# Patient Record
Sex: Female | Born: 1945 | Race: Asian | Hispanic: No | Smoking: Never smoker
Health system: Southern US, Community
[De-identification: ages and names within clinical notes are randomized; demographics above are authoritative.]

## PROBLEM LIST (undated history)

## (undated) DIAGNOSIS — E079 Disorder of thyroid, unspecified: Secondary | ICD-10-CM

## (undated) DIAGNOSIS — I10 Essential (primary) hypertension: Secondary | ICD-10-CM

## (undated) DIAGNOSIS — E119 Type 2 diabetes mellitus without complications: Secondary | ICD-10-CM

---

## 2010-06-09 ENCOUNTER — Emergency Department (HOSPITAL_BASED_OUTPATIENT_CLINIC_OR_DEPARTMENT_OTHER): Admission: EM | Admit: 2010-06-09 | Discharge: 2010-06-09 | Payer: Self-pay | Admitting: Emergency Medicine

## 2010-06-09 ENCOUNTER — Ambulatory Visit: Payer: Self-pay | Admitting: Diagnostic Radiology

## 2010-10-11 LAB — COMPREHENSIVE METABOLIC PANEL
AST: 54 U/L — ABNORMAL HIGH (ref 0–37)
Albumin: 4.7 g/dL (ref 3.5–5.2)
Alkaline Phosphatase: 134 U/L — ABNORMAL HIGH (ref 39–117)
BUN: 7 mg/dL (ref 6–23)
CO2: 26 mEq/L (ref 19–32)
Chloride: 107 mEq/L (ref 96–112)
GFR calc Af Amer: >60 mL/min (ref >60)
GFR calc non Af Amer: >60 mL/min (ref >60)
Potassium: 3.5 mEq/L (ref 3.5–5.1)
Total Bilirubin: 0.6 mg/dL (ref 0.3–1.2)

## 2010-10-11 LAB — CBC
Hemoglobin: 14.8 g/dL (ref 12.0–15.0)
MCH: 30.2 pg (ref 26.0–34.0)
MCV: 87.4 fL (ref 78.0–100.0)
Platelets: 193 10*3/uL (ref 150–400)
RBC: 4.89 MIL/uL (ref 3.87–5.11)
WBC: 9.5 10*3/uL (ref 4.0–10.5)

## 2010-10-11 LAB — URINALYSIS, ROUTINE W REFLEX MICROSCOPIC
Bilirubin Urine: NEGATIVE
Ketones, ur: NEGATIVE mg/dL
Nitrite: NEGATIVE
Specific Gravity, Urine: 1.008 (ref 1.005–1.030)
Urobilinogen, UA: 0.2 mg/dL (ref 0.0–1.0)

## 2010-10-11 LAB — URINE MICROSCOPIC-ADD ON

## 2010-10-11 LAB — DIFFERENTIAL
Basophils Absolute: 0 10*3/uL (ref 0.0–0.1)
Basophils Relative: 0 % (ref 0–1)
Eosinophils Absolute: 0.8 10*3/uL — ABNORMAL HIGH (ref 0.0–0.7)
Eosinophils Relative: 8 % — ABNORMAL HIGH (ref 0–5)
Monocytes Absolute: 0.6 10*3/uL (ref 0.1–1.0)

## 2010-10-11 LAB — POCT CARDIAC MARKERS: Troponin i, poc: <0.05 ng/mL (ref 0.00–0.09)

## 2010-10-11 LAB — PROTIME-INR: Prothrombin Time: 12.3 seconds (ref 11.6–15.2)

## 2011-05-17 ENCOUNTER — Other Ambulatory Visit: Payer: Self-pay

## 2011-05-17 ENCOUNTER — Emergency Department (HOSPITAL_BASED_OUTPATIENT_CLINIC_OR_DEPARTMENT_OTHER)
Admission: EM | Admit: 2011-05-17 | Discharge: 2011-05-17 | Disposition: A | Discharge status: Home / Self Care | Payer: Medicare Other | Attending: Emergency Medicine | Admitting: Emergency Medicine

## 2011-05-17 ENCOUNTER — Encounter: Payer: Self-pay | Admitting: Family Medicine

## 2011-05-17 ENCOUNTER — Emergency Department (INDEPENDENT_AMBULATORY_CARE_PROVIDER_SITE_OTHER): Payer: Medicare Other

## 2011-05-17 DIAGNOSIS — R42 Dizziness and giddiness: Secondary | ICD-10-CM

## 2011-05-17 DIAGNOSIS — I10 Essential (primary) hypertension: Secondary | ICD-10-CM

## 2011-05-17 DIAGNOSIS — E041 Nontoxic single thyroid nodule: Secondary | ICD-10-CM | POA: Insufficient documentation

## 2011-05-17 DIAGNOSIS — I517 Cardiomegaly: Secondary | ICD-10-CM

## 2011-05-17 HISTORY — DX: Essential (primary) hypertension: I10

## 2011-05-17 LAB — CBC
HCT: 40.2 % (ref 36.0–46.0)
Hemoglobin: 14 g/dL (ref 12.0–15.0)
RBC: 4.73 MIL/uL (ref 3.87–5.11)
RDW: 12.1 % (ref 11.5–15.5)
WBC: 8.5 10*3/uL (ref 4.0–10.5)

## 2011-05-17 LAB — BASIC METABOLIC PANEL
BUN: 9 mg/dL (ref 6–23)
CO2: 25 mEq/L (ref 19–32)
Chloride: 103 mEq/L (ref 96–112)
GFR calc Af Amer: >90 mL/min (ref >90)
Glucose, Bld: 116 mg/dL — ABNORMAL HIGH (ref 70–99)
Potassium: 3.6 mEq/L (ref 3.5–5.1)

## 2011-05-17 MED ORDER — CLONIDINE HCL 0.1 MG PO TABS
0.1000 mg | ORAL_TABLET | Freq: Once | ORAL | Status: AC
  Administered 2011-05-17: 0.1 mg via ORAL
  Filled 2011-05-17: qty 1

## 2011-05-17 NOTE — ED Notes (Signed)
Pt granddaughter sts pt went to PCP today for checkup and was told to go to ED due to elevated BP. Pt sts she feels "fine". Pt is currently taking medication for BP.

## 2011-05-17 NOTE — ED Provider Notes (Signed)
History     CSN: [id number] Arrival date & time: 05/17/2011 12:08 PM   First MD Initiated Contact with Patient 05/17/11 1218      Chief Complaint  Patient presents with  . Hypertension    (Consider location/radiation/quality/duration/timing/severity/associated sxs/prior treatment) HPI Comments: Pt states that she was over at her doctors office and she was sent over here  Patient is a 64 y.o. female presenting with hypertension. Analysia history is provided by Patty patient. Cheryal history is limited by a language barrier. A language interpreter was used.  Hypertension This is a chronic problem. Shyia current episode started today. Judeen problem occurs constantly. Lynee problem has been unchanged. Pertinent negatives include no chest pain, diaphoresis, headaches, numbness, visual change or weakness. Vessie symptoms are aggravated by nothing. Treatments tried: her normal bp medications.    Past Medical History  Diagnosis Date  . Hypertension     History reviewed. No pertinent past surgical history.  No family history on file.  History  Substance Use Topics  . Smoking status: Never Smoker   . Smokeless tobacco: Not on file  . Alcohol Use: No    OB History    Grav Para Term Preterm Abortions TAB SAB Ect Mult Living                  Review of Systems  Constitutional: Negative for diaphoresis.  Cardiovascular: Negative for chest pain.  Neurological: Negative for weakness, numbness and headaches.  All other systems reviewed and are negative.    Allergies  Review of patient's allergies indicates no known allergies.  Home Medications  No current outpatient prescriptions on file.  BP 248/101  Pulse 86  Temp(Src) 97.4 F (36.3 C) (Oral)  Resp 16  Ht 5\' 4"  (1.626 m)  Wt 175 lb (79.379 kg)  BMI 30.04 kg/m2  SpO2 98%  Physical Exam  Nursing note and vitals reviewed. Constitutional: She is oriented to person, place, and time. She appears well-nourished.  HENT:  Head:  Normocephalic.  Eyes: Pupils are equal, round, and reactive to light.  Neck: Normal range of motion.  Cardiovascular: Normal rate and regular rhythm.   Pulmonary/Chest: Effort normal and breath sounds normal.  Abdominal: Soft. Bowel sounds are normal.  Musculoskeletal: Normal range of motion.  Neurological: She is alert and oriented to person, place, and time.  Skin: Skin is warm and dry.  Psychiatric: She has a normal mood and affect.    ED Course  Procedures (including critical care time)  Labs Reviewed  BASIC METABOLIC PANEL - Abnormal; Notable for [place] following:    Glucose, Bld 116 (*)    Creatinine, Ser 0.40 (*)    All other components within normal limits  CBC   Dg Chest 2 View  05/17/2011  *RADIOLOGY REPORT*  Clinical Data: Hypertension, dizziness  CHEST - 2 VIEW  Comparison: 06/09/2010  Findings: Enlargement of cardiac silhouette. Tortuous aorta. Prominent right paratracheal soft tissues with right-to-left mass effect upon Kayna trachea question right thyroid mass. Minimal scarring right middle lobe. Lungs otherwise clear. Minimal peribronchial thickening. No pleural effusion or pneumothorax. Bones unremarkable.  IMPRESSION: Enlargement of cardiac silhouette. Question right thyroid mass; recommend thyroid sonography to evaluate. Minimal right middle lobe scarring. No acute pulmonary abnormalities.  Original Report Authenticated By: Lollie Marrow, M.D.   Date: 05/17/2011  Rate: 82  Rhythm: normal sinus rhythm  QRS Axis: normal  Intervals: normal  ST/T Wave abnormalities: normal  Conduction Disutrbances:none  Narrative Interpretation:   Old EKG Reviewed: none  available and unchanged    1. Hypertension   2. Thyroid nodule       MDM  You need to make sure that you are taking all your blood pressure medication: and you should follow up with your doctor later this week        Teressa Lower, NP 05/17/11 1529

## 2011-05-17 NOTE — ED Provider Notes (Signed)
History     CSN: [id number] Arrival date & time: 05/17/2011 12:08 PM   First MD Initiated Contact with Patient 05/17/11 1218      Chief Complaint  Patient presents with  . Hypertension    (Consider location/radiation/quality/duration/timing/severity/associated sxs/prior treatment) HPI  Past Medical History  Diagnosis Date  . Hypertension     History reviewed. No pertinent past surgical history.  No family history on file.  History  Substance Use Topics  . Smoking status: Never Smoker   . Smokeless tobacco: Not on file  . Alcohol Use: No    OB History    Grav Para Term Preterm Abortions TAB SAB Ect Mult Living                  Review of Systems  Allergies  Lisinopril  Home Medications  No current outpatient prescriptions on file.  BP 217/103  Pulse 87  Temp(Src) 97.4 F (36.3 C) (Oral)  Resp 16  Ht 5\' 4"  (1.626 m)  Wt 175 lb (79.379 kg)  BMI 30.04 kg/m2  SpO2 99%  Physical Exam  ED Course  Procedures (including critical care time)  Labs Reviewed  BASIC METABOLIC PANEL - Abnormal; Notable for April Sheppard following:    Glucose, Bld 116 (*)    Creatinine, Ser 0.40 (*)    All other components within normal limits  CBC   Dg Chest 2 View  05/17/2011  *RADIOLOGY REPORT*  Clinical Data: Hypertension, dizziness  CHEST - 2 VIEW  Comparison: 06/09/2010  Findings: Enlargement of cardiac silhouette. Tortuous aorta. Prominent right paratracheal soft tissues with right-to-left mass effect upon April Sheppard trachea question right thyroid mass. Minimal scarring right middle lobe. Lungs otherwise clear. Minimal peribronchial thickening. No pleural effusion or pneumothorax. Bones unremarkable.  IMPRESSION: Enlargement of cardiac silhouette. Question right thyroid mass; recommend thyroid sonography to evaluate. Minimal right middle lobe scarring. No acute pulmonary abnormalities.  Original Report Authenticated By: Lollie Marrow, M.D.     1. Hypertension   2. Thyroid nodule        MDM    Medical screening examination/treatment/procedure(s) were conducted as a shared visit with non-physician practitioner(s) and myself.  I personally evaluated April Sheppard patient during [name] encounter.  Pt with HTN here but no other sx. Unclear if pt is taking medications correctly.  Pt given clonidine to see if improves BP.  Waiting to see what she is taking.       Gwyneth Sprout, MD 05/17/11 1626

## 2012-10-22 IMAGING — CR DG CHEST 2V
2 series · 2 of 2 positions shown · non-contrast
Comparison: None.

CLINICAL DATA: Chest pain and shortness of breath.

CHEST - 2 VIEW

[w chest pa]
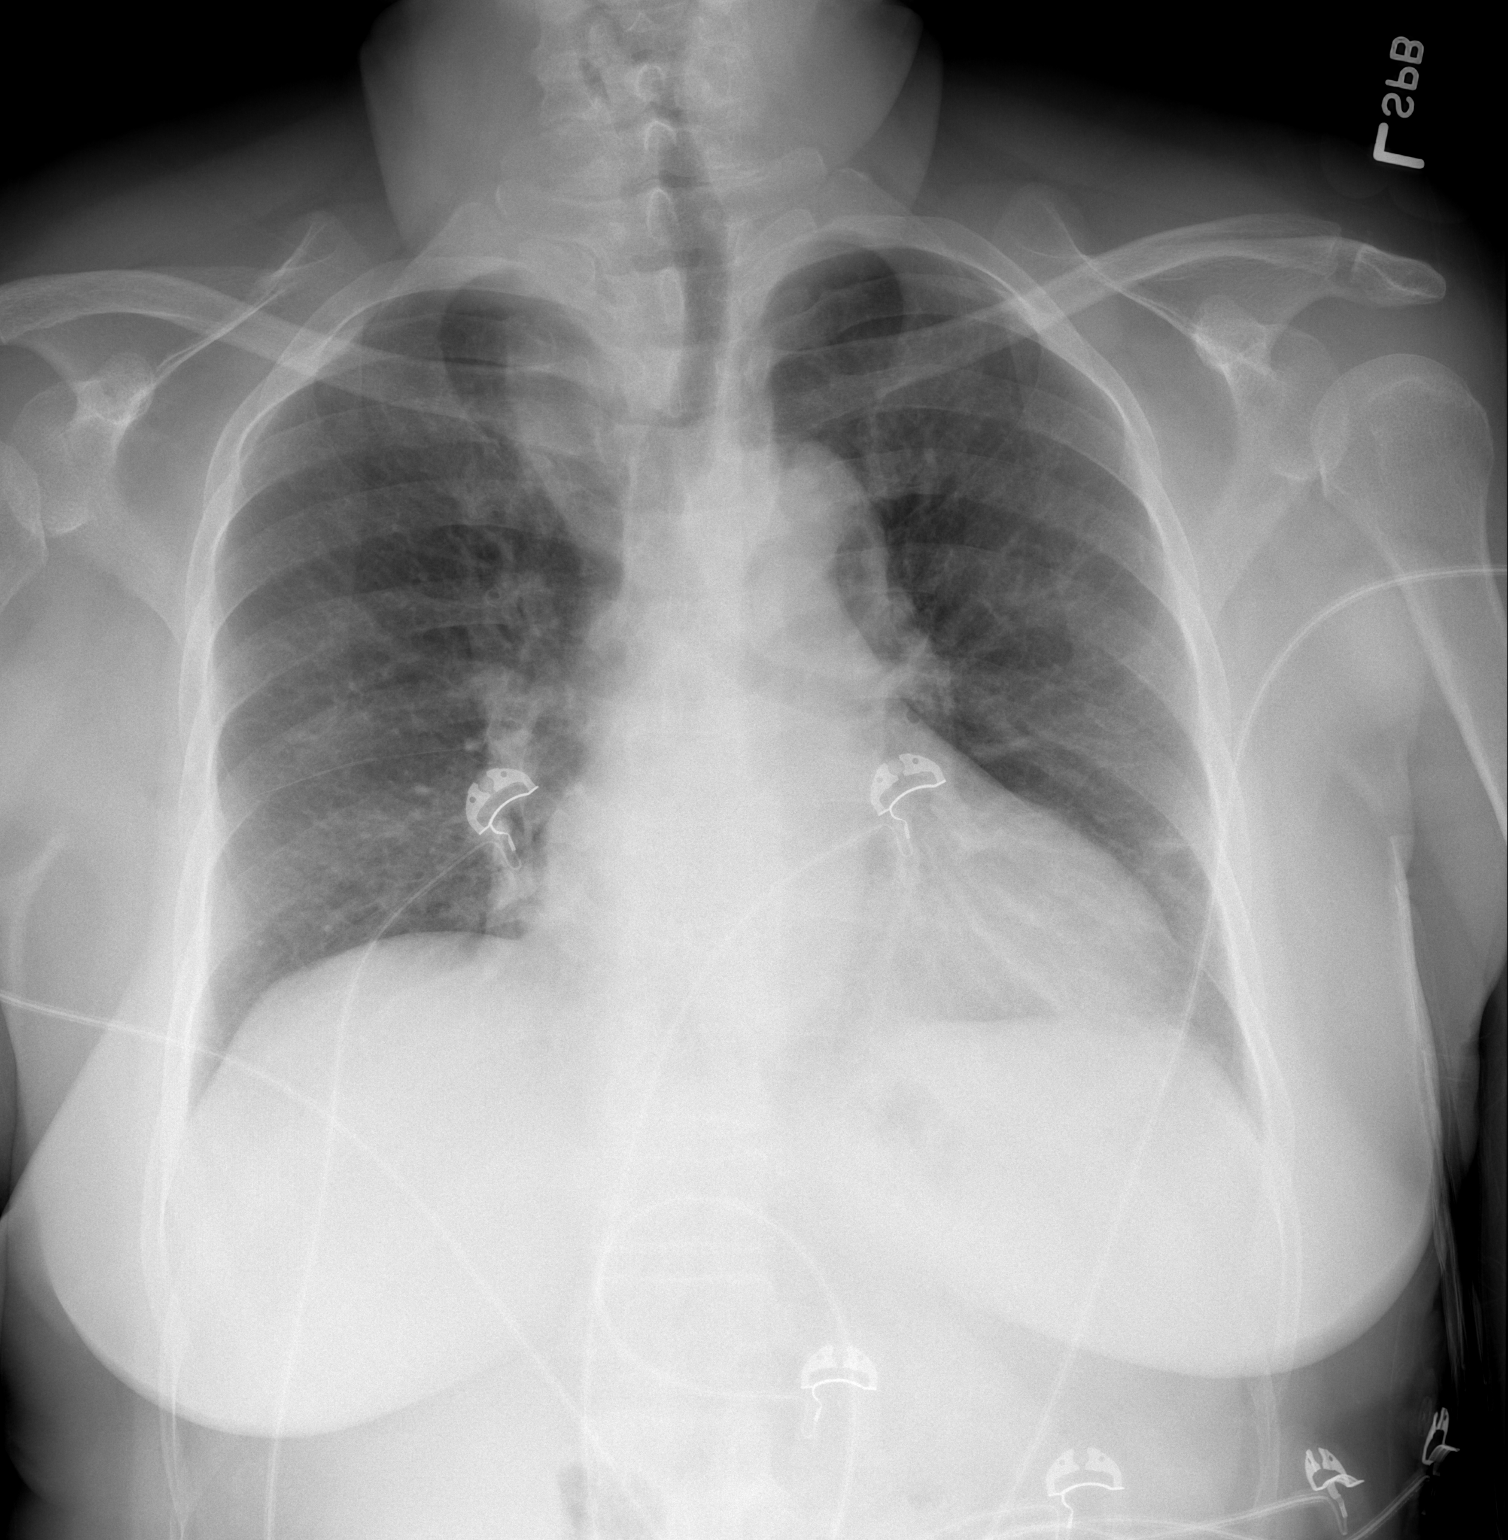

[w chest lat]
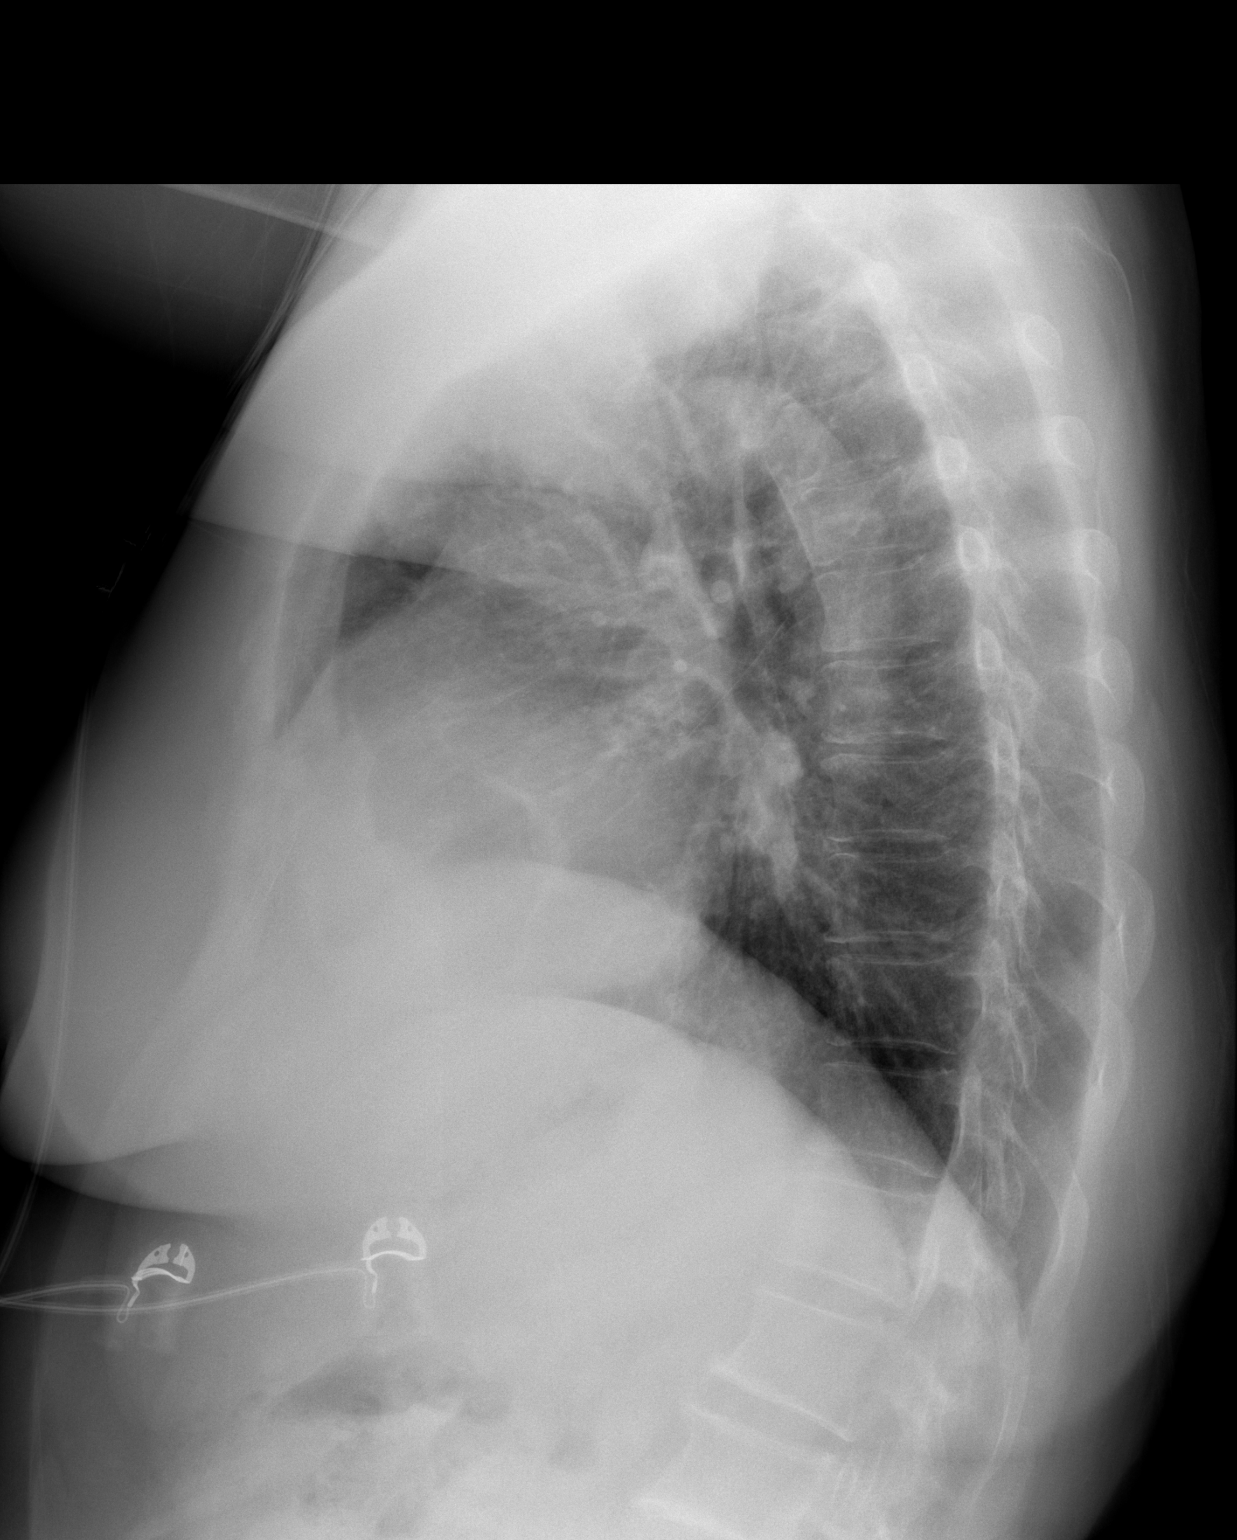

[2 of 2 positions shown; findings below may reference images not displayed]

FINDINGS: There is mild cardiomegaly.  The vascularity is normal
and the lungs are clear except for a tiny area of atelectasis or
scarring at the left lung base.

There is a mass effect upon the right side of the trachea just
above the thoracic inlet, probably due to enlargement of the right
lobe of the thyroid gland.

No osseous abnormality.
IMPRESSION: 1.  Mild cardiomegaly.
2.  Mass effect upon the trachea probably by an enlarged right lobe
of the thyroid gland.

## 2013-09-29 IMAGING — CR DG CHEST 2V
2 series · 2 of 2 positions shown · non-contrast
Comparison: 06/09/2010

CLINICAL DATA: Hypertension, dizziness

CHEST - 2 VIEW

[w chest pa]
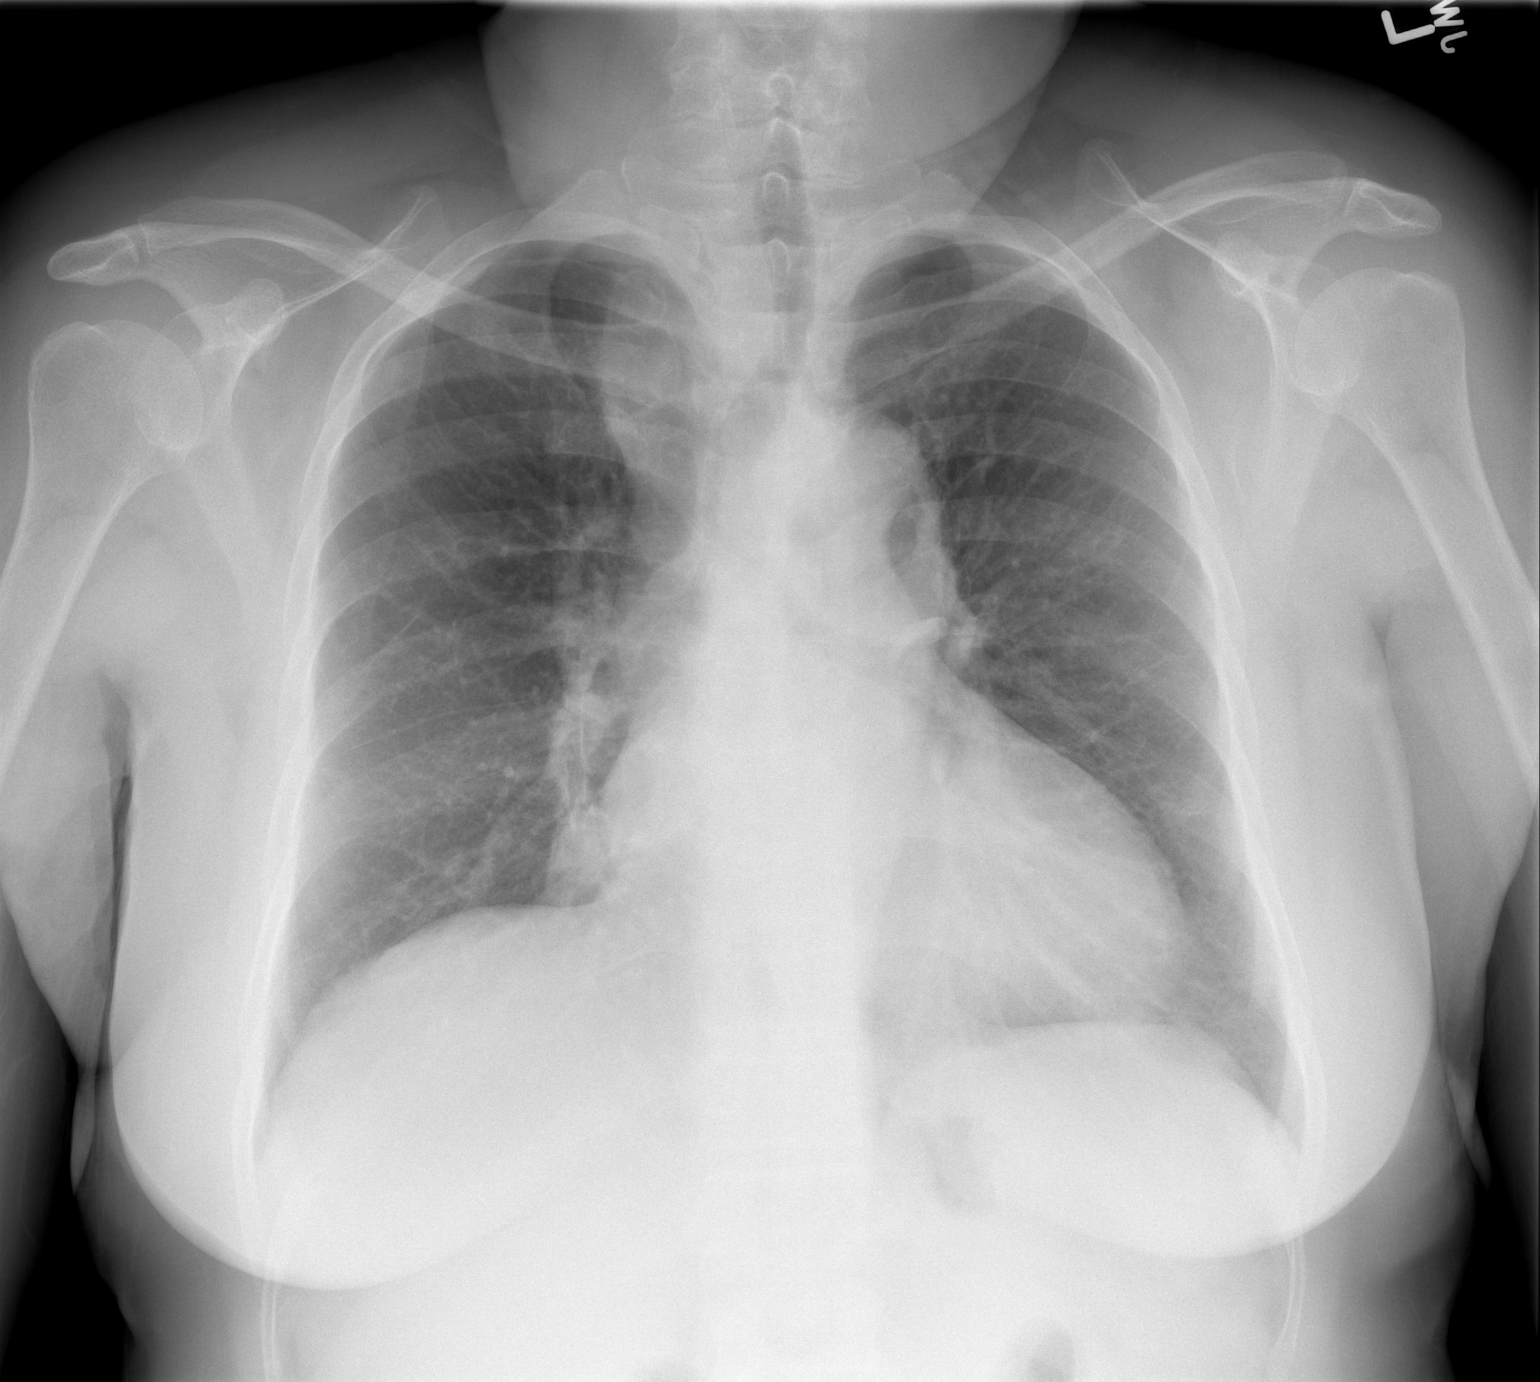

[w chest lat]
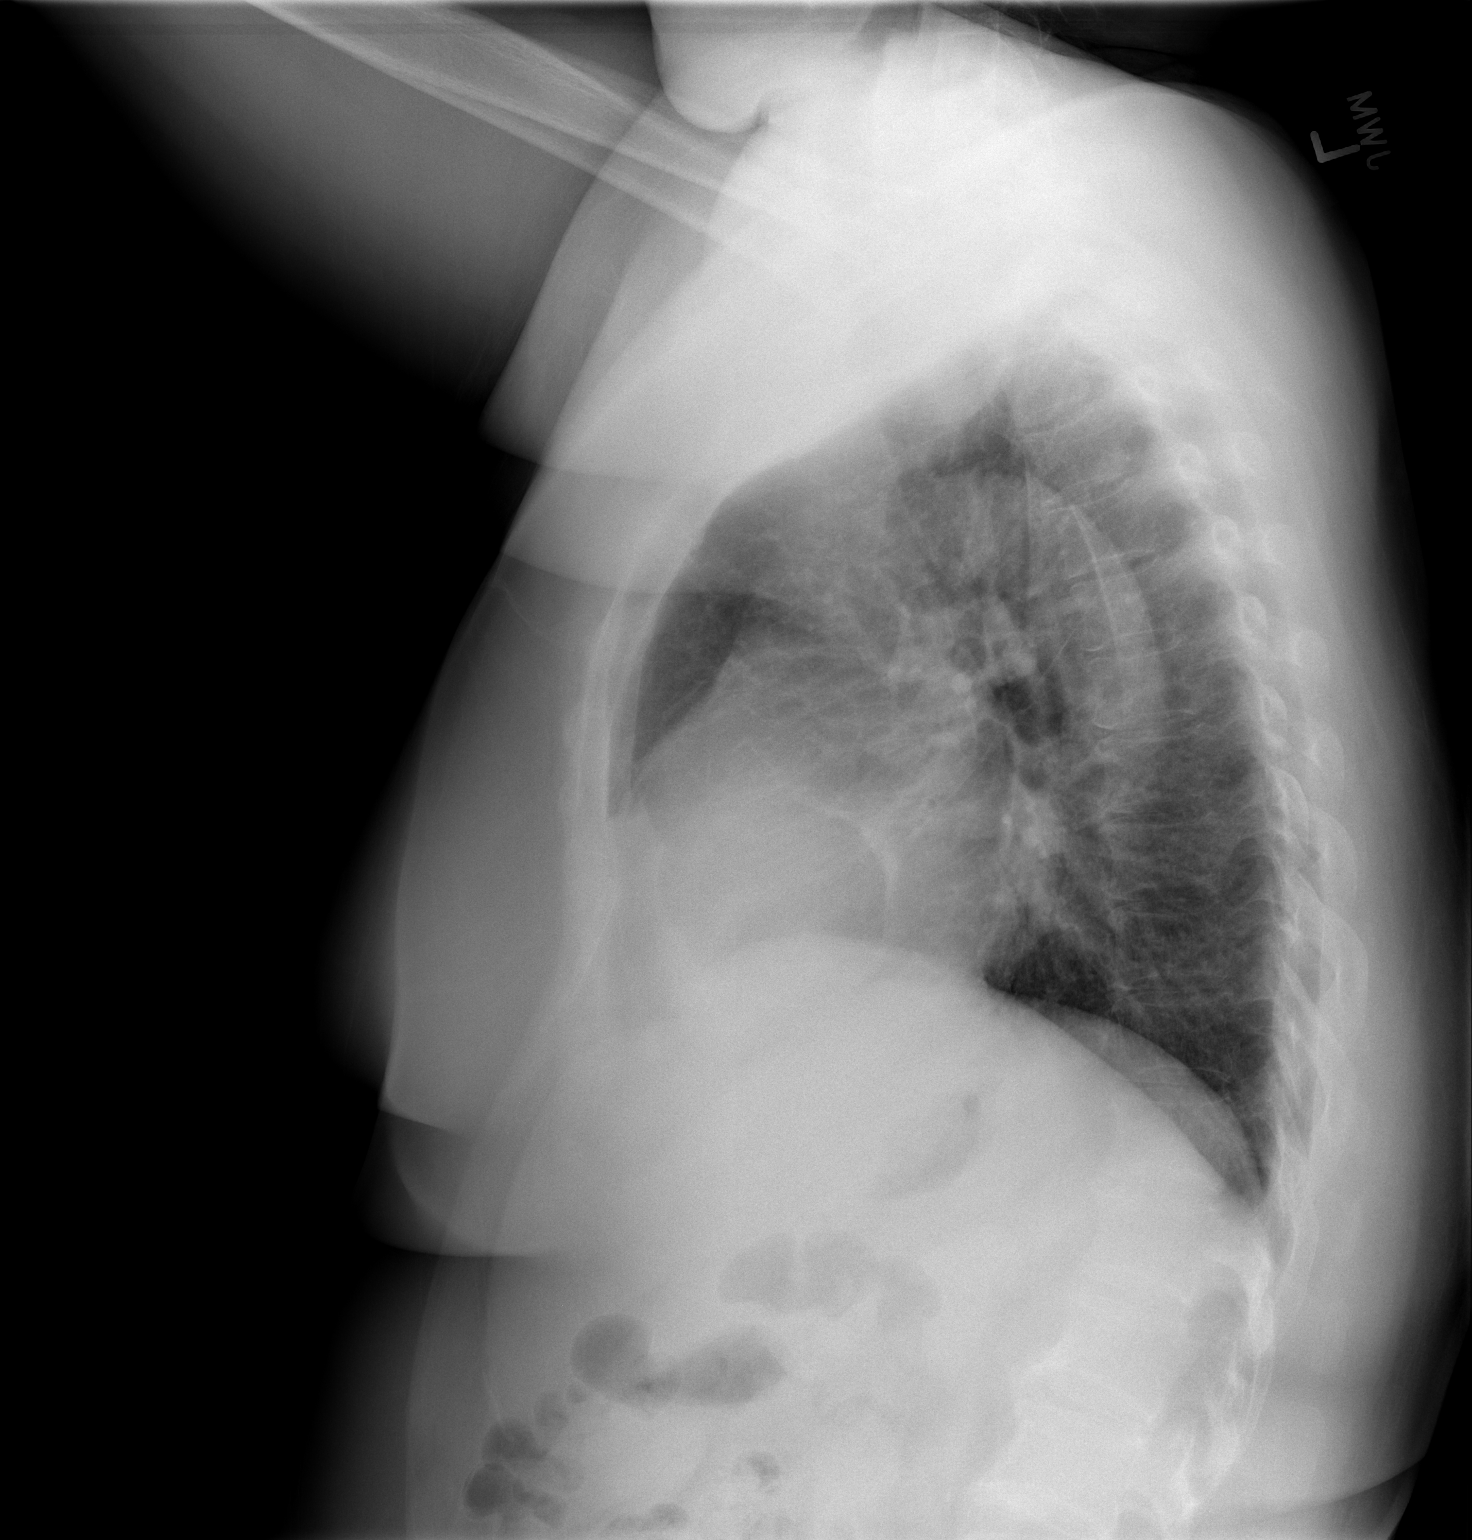

[2 of 2 positions shown; findings below may reference images not displayed]

FINDINGS: Enlargement of cardiac silhouette.
Tortuous aorta.
Prominent right paratracheal soft tissues with right-to-left mass
effect upon the trachea question right thyroid mass.
Minimal scarring right middle lobe.
Lungs otherwise clear.
Minimal peribronchial thickening.
No pleural effusion or pneumothorax.
Bones unremarkable.
IMPRESSION: Enlargement of cardiac silhouette.
Question right thyroid mass; recommend thyroid sonography to
evaluate.
Minimal right middle lobe scarring.
No acute pulmonary abnormalities.

## 2013-11-24 ENCOUNTER — Encounter (HOSPITAL_COMMUNITY): Payer: Self-pay | Admitting: Emergency Medicine

## 2013-11-24 ENCOUNTER — Emergency Department (INDEPENDENT_AMBULATORY_CARE_PROVIDER_SITE_OTHER)
Admission: EM | Admit: 2013-11-24 | Discharge: 2013-11-24 | Disposition: A | Discharge status: Home / Self Care | Payer: Medicare Other | Source: Home / Self Care | Attending: Family Medicine | Admitting: Family Medicine

## 2013-11-24 DIAGNOSIS — I10 Essential (primary) hypertension: Secondary | ICD-10-CM

## 2013-11-24 HISTORY — DX: Type 2 diabetes mellitus without complications: E11.9

## 2013-11-24 HISTORY — DX: Disorder of thyroid, unspecified: E07.9

## 2013-11-24 LAB — POCT I-STAT, CHEM 8
BUN: 8 mg/dL (ref 6–23)
Calcium, Ion: 1.18 mmol/L (ref 1.13–1.30)
Chloride: 100 mEq/L (ref 96–112)
Creatinine, Ser: 0.6 mg/dL (ref 0.50–1.10)
GLUCOSE: 200 mg/dL — AB (ref 70–99)
HEMATOCRIT: 42 % (ref 36.0–46.0)
HEMOGLOBIN: 14.3 g/dL (ref 12.0–15.0)
POTASSIUM: 3.7 meq/L (ref 3.7–5.3)
SODIUM: 139 meq/L (ref 137–147)
TCO2: 29 mmol/L (ref 0–100)

## 2013-11-24 MED ORDER — HYDROCHLOROTHIAZIDE 25 MG PO TABS: 25.0000 mg | ORAL_TABLET | Freq: Every day | ORAL | Status: DC

## 2013-11-24 MED ORDER — AMLODIPINE BESYLATE 10 MG PO TABS: 10.0000 mg | ORAL_TABLET | Freq: Every day | ORAL | Status: DC

## 2013-11-24 NOTE — ED Notes (Signed)
Was scheduled for eye surgery today, but was delayed due to hypertension. NAD

## 2013-11-24 NOTE — ED Provider Notes (Signed)
CSN: [id number]     Arrival date & time 11/24/13  1314 History   First MD Initiated Contact with Patient 11/24/13 1532     Chief Complaint  Patient presents with  . Hypertension   (Consider location/radiation/quality/duration/timing/severity/associated sxs/prior Treatment) Patient is a 68 y.o. female presenting with hypertension. Dashanique history is provided by Tiffanee patient.  Hypertension This is a chronic problem. Yana current episode started 3 to 5 hours ago (long standing hbp, went to surg center this am  for cataract  surg, found to have hbp 242/133 and surg cancelled. sent here for eval, pt admits to being nervous.at Marianny time.). [place] problem has been gradually improving. Pertinent negatives include no chest pain, no abdominal pain, no headaches and no shortness of breath.    Past Medical History  Diagnosis Date  . Hypertension   . Diabetes mellitus without complication   . Thyroid disease    History reviewed. No pertinent past surgical history. History reviewed. No pertinent family history. History  Substance Use Topics  . Smoking status: Never Smoker   . Smokeless tobacco: Not on file  . Alcohol Use: No   OB History   Grav Para Term Preterm Abortions TAB SAB Ect Mult Living                 Review of Systems  Constitutional: Negative.   Respiratory: Negative for chest tightness and shortness of breath.   Cardiovascular: Negative for chest pain, palpitations and leg swelling.  Gastrointestinal: Negative for abdominal pain.  Neurological: Negative for headaches.    Allergies  Lisinopril  Home Medications   Prior to Admission medications   Medication Sig Start Date End Date Taking? Authorizing Provider  amLODipine (NORVASC) 5 MG tablet Take 5 mg by mouth daily.      Historical Provider, MD  carvedilol (COREG) 12.5 MG tablet Take 12.5 mg by mouth 2 (two) times daily with a meal.      Historical Provider, MD  hydrALAZINE (APRESOLINE) 25 MG tablet Take 25 mg by mouth 4 (four)  times daily.      Historical Provider, MD  potassium chloride (KLOR-CON) 10 MEQ CR tablet Take 10 mEq by mouth daily.      Historical Provider, MD  triamterene-hydrochlorothiazide (DYAZIDE) 37.5-25 MG per capsule Take 1 capsule by mouth every morning.      Historical Provider, MD   BP 181/85  Pulse 67  Temp(Src) 98.6 F (37 C) (Oral)  Resp 20  SpO2 98% Physical Exam  Nursing note and vitals reviewed. Constitutional: She is oriented to person, place, and time. She appears well-developed and well-nourished.  Neck: Normal range of motion. Neck supple.  Cardiovascular: Normal heart sounds and intact distal pulses.   Pulmonary/Chest: Effort normal and breath sounds normal.  Musculoskeletal: She exhibits no edema.  Lymphadenopathy:    She has no cervical adenopathy.  Neurological: She is alert and oriented to person, place, and time.  Skin: Skin is warm and dry.    ED Course  Procedures (including critical care time) Labs Review Labs Reviewed  POCT I-STAT, CHEM 8 - Abnormal; Notable for Romanda following:    Glucose, Bld 200 (*)    All other components within normal limits    Imaging Review No results found.   MDM   1. Hypertension, accelerated        Linna HoffJames D Taj Arteaga, MD 11/24/13 [phone]

## 2020-12-17 NOTE — Unmapped External Note (Signed)
 This RN called to bedside by discharge lounge staff- pt found on her knees bedside bed. Pt assisted to seated position on [place] bed. Granddaughter called to assist with communication. Situation explained to granddaughter and staff requested she ask what happened. Pt and granddaughter report she was on [place] ground looking for shoes. No pain or discomfort reported. Pt able to stand and ambulate with walker and standby assistance. Pt reports she is going home and did not fall.    Electronically signed by: Monika Nichole Millen, RN 12/17/20 [phone]

## 2020-12-17 NOTE — Discharge Summary (Signed)
 ------------------------------------------------------------------------------- Attestation signed by Burnie April Coad, MD at 12/24/2020 11:41 AM . -------------------------------------------------------------------------------  Surgery Discharge Summary  Patient ID: April Sheppard [id number] 75 y.o. 1945-09-25  Admit date: 12/07/2020 Admitting Physician: Reta Peace, MD Admission Diagnoses:  NSTEMI  Discharge date: 12/17/20 Discharge Physician: Burnie, MD Discharge Diagnoses:  Principal Problem:   NSTEMI [facility]) Active Problems:   Type 2 diabetes mellitus (HCC)   Essential hypertension   Triple-vessel coronary artery disease   Hypothyroidism   Hypercholesterolemia   S/P CABG x 3   Acute blood loss as cause of postoperative anemia Resolved Problems:   Postoperative delirium   Thrombocytopenia (HCC)  Admission Condition: fair Discharged Condition: good  Indication for Admission: NSTEMI  Hospital Course:   April Sheppard is a 75 year old [place] female (speaks [place] only, granddaughter interpreter) presented to [facility] ED with chest pain and dyspnea patient reports symptoms started 2 days prior.  She underwent a work-up which led to a diagnosis of NSTEMI.  Cardiology was consulted and she underwent a cardiac catheterization and was found to have multi vessel coronary artery disease.  She has a medical history significant for hypertension, diabetes, and hypothyroidism.  CT surgery was consulted for possible surgical evaluation.  April Sheppard patient was evaluated and felt to be an adequate candidate for coronary artery bypass grafting.  This was explained to April Sheppard patient as well as her family and she agreed to move forward with April Sheppard surgery.  She was taken to April Sheppard operating room on 12/14/2019 where a three-vessel coronary artery bypass grafting took place without complication.  She was weaned and separated from April Sheppard cardiopulmonary bypass machine with April Sheppard use of inotropes.  She was transferred to  April Sheppard CCU intubated but in stable condition.  She is able to be extubated April Sheppard same day of surgery and maintained adequate oxygen saturations throughout her hospitalization.  On postoperative day #1 her chest tubes were changed to bulb suction.  Her pacing wires were rolled and taped.  Gentle diuresis was initiated.  She was able to initiate work with PT and OT.  Her hemodynamics remained stable.  She was able to be transferred to April Sheppard.  On postoperative day #2 it was noted that she had a difficult overnight with confusion and agitation.  As needed Haldol was given with good results.  She was able to get some rest early morning.  She remained hemodynamically stable.  Her blood pressure medications were adjusted due to mild hypertension.  Her April Sheppard drains were removed.  She continued with diuresis.  Subcu heparin was initiated for DVT prophylaxis.  On postoperative day #3 patient had a somewhat improved overnight with less confusion.  She continued with IV diuresis.  Her Foley catheter was removed this evening.  Her pacing wires were also removed.  Her chest x-April Sheppard was satisfactory.  She was breathing well on room air.  On postoperative day #4 April Sheppard patient remained hemodynamically stable.  Morning labs were satisfactory.  She was deemed stable for discharge home.  Home physical therapy was offered however family declined in addition to declining any inpatient rehab admission.  At April Sheppard time of discharge she is alert and oriented, afebrile, asymptomatic and has a stable sternum.  She has adequate bowel and bladder function.  She will be scheduled for follow-up visit with our office as well as follow-up appointments with her PCP and cardiologist.  Procedures/Surgeries performed during hospitalization: Procedure(s) (LRB): CABG ON PUMP - CORONARY ARTERY BYPASS GRAFTING X 3 (N/A)  Consults:  Orders Placed This  Encounter  Procedures  . Consult To Cardiology  . Consult To Rehabilitation    Significant  Diagnostic Studies:  labs:  Results for April Sheppard, April Sheppard (MRN [id number]) as of 12/18/2020 10:38  Ref. Range 12/17/2020 09:32  WBC Latest Ref Range: 4.4 - 11.0 x 10*3/uL 10.3  Red Blood Count Latest Ref Range: 4.10 - 5.10 x 10*6/uL 4.35  HEMOGLOBIN Latest Ref Range: 12.3 - 15.3 G/DL 86.9  HEMATOCRIT Latest Ref Range: 35.9 - 44.6 % 38.5  MCV Latest Ref Range: 80.0 - 96.0 FL 88.4  MCH Latest Ref Range: 27.5 - 33.2 PG 29.7  MCHC Latest Ref Range: 33.0 - 37.0 G/DL 66.2  RDW Latest Units: % 13.0  PLATELET COUNT Latest Ref Range: 150 - 450 X 10*3/uL 224  MPV Latest Ref Range: 6.8 - 10.2 FL 9.5  SODIUM Latest Ref Range: 135 - 146 MMOL/L 135  POTASSIUM Latest Ref Range: 3.5 - 5.3 MMOL/L 3.5  CHLORIDE Latest Ref Range: 98 - 110 MMOL/L 101  CO2 Latest Ref Range: 23 - 30 MMOL/L 25  BUN Latest Ref Range: 8 - 24 MG/DL 19  GLUCOSE Latest Ref Range: 70 - 99 MG/DL 706 (H)  CREATININE Latest Ref Range: 0.50 - 1.50 MG/DL 9.31  CALCIUM Latest Ref Range: 8.5 - 10.5 MG/DL 9.3  ANION GAP Latest Ref Range: 4 - 14 MMOL/L 9  Est. GFR Latest Ref Range: >=60 ML/MIN/1.73 M*2  >90    Treatments:  surgery: CABG X 3  Discharge Exam: General: Alert, oriented X 4, no apparent distress.  Head-Eyes-Ears-Nose-Throat: EOMI, pupils equal  Cardiovascular: Regular rate and rhythm, s1, s2, no murmurs.  Chest/Lungs: Clear to auscultation bilaterally, no wheezes or rhonchi.  Abdomen: Positive bowel sounds. Abdomen soft, nondistended, nontender. No HSM, rebound or guarding.  Extremities: 2+ peripheral pulses  Skin:  Incision is clean dry and intact  Other:  None    Disposition: Home  Patient Instructions:  Discharge Medications: Discharge Medication List as of 12/17/2020  2:11 PM    START taking these medications   Details  aspirin 81 MG chewable tablet *ANTIPLATELET* Take 1 tablet (81 mg total) by mouth daily., Starting Sat 12/18/2020, Until Fri 03/18/2021, Normal    atorvastatin (LIPITOR) 80 MG tablet Take 1 tablet (80 mg  total) by mouth daily at 6pm., Starting Fri 12/17/2020, Normal    clopidogreL (PLAVIX) 75 mg tablet  *ANTIPLATELET* Take 1 tablet (75 mg total) by mouth daily. Stop date: 11/2021, Starting Sat 12/18/2020, Until Sun 12/18/2021, Normal    furosemide (LASIX) 40 MG tablet Take 1 tablet (40 mg total) by mouth daily for 7 days., Starting Fri 12/17/2020, Until Fri 12/24/2020, Normal    potassium chloride ER (KLOR-CON-M, K-DUR) 20 MEQ extended release tablet Take 1 tablet (20 mEq total) by mouth daily for 7 days., Starting Fri 12/17/2020, Until Fri 12/24/2020, Normal    traMADoL (ULTRAM) 50 mg tablet Take 1 tablet (50 mg total) by mouth every 4 (four) hours as needed for up to 7 days., Starting Fri 12/17/2020, Until Fri 12/24/2020 at 2359, Normal      CONTINUE these medications which have NOT CHANGED   Details  carvediloL  (COREG ) 12.5 MG tablet TAKE 1 TABLET(12.5 MG) BY MOUTH TWICE DAILY WITH MEALS, Normal    empagliflozin (JARDIANCE) 25 mg Tab tablet Take 1 tablet (25 mg total) by mouth daily., Starting Mon 10/25/2020, Until Sun 01/23/2021, Normal    levothyroxine (SYNTHROID) 112 MCG tablet TAKE 1 TABLET(112 MCG) BY MOUTH DAILY AT 6 AM, Normal  metFORMIN (GLUCOPHAGE) 1000 MG tablet Take 1 tablet (1,000 mg total) by mouth 2 times daily with meals., Starting Mon 10/25/2020, Normal      STOP taking these medications     amLODIPine  (NORVASC ) 10 MG tablet      hydrALAZINE (APRESOLINE) 10 MG tablet      triamterene-hydrochlorothiazide  (DYAZIDE) 37.5-25 mg per capsule           Discharge Orders and Instructions    Walker   Complete by: As directed    Height: 1.575 m (5' 2)   Weight: 64.1 kg (141 lb 6.4 oz)   Length of Need: 99/Lifetime   Option(s): Rolling   Diet At Discharge   Complete by: As directed    Recommended diet at discharge: Cardiac diet   Activity At Discharge   Complete by: As directed    Recommended activity at discharge: Activity as tolerated   Driving restriction: No driving  while taking narcotic pain medication   Contact Us  / Call Us  To Schedule An Appointment   Complete by: As directed    Call: [phone]   Call if temperature greater than (Farenheit): 100.4   Call if: Bleeding present   Call if: Unable to control pain with prescribed pain medication   Physical Therapy Prescription for Home Health   Complete by: As directed       Follow-up Appointments [facility] APPOINTMENTS    Appointment Date and Time Provider Department Dept Phone Address   01/17/2021 10:15 AM [name] Lamar Sieving, DO Cardiology - [facility] Bld[facility] [phone] [address] [address]       Contact information for after-discharge care    Durable Medical Equipment    [facility] [place] .   Service: Durable Medical Equipment Contact information: [address] [place]  [address] [phone]                  Electronically signed by: April Almeda Greet, PA-C 12/17/2020 1:44 PM  Time spent on discharge: 30 mintues   Electronically signed by: April Almeda Greet, PA-C 12/18/20 1039    Electronically signed by: April Denise Rana, MD 12/24/20 1141

## 2021-01-17 NOTE — Progress Notes (Signed)
 Patient Name: April Sheppard Patient Date of Birth: Dec 13, 1945 Patient MR#: [id number]  PCP: Lum Vivian Dross, [place] Date: 01/17/2021      Cardiology Clinic Note  Assessment and Plan:    1.  Coronary artery disease with non-STEMI, now status post CABG.  April Sheppard patient seems to have recovered well from her bypass surgery.  Her incision looks okay.  She has declined cardiac rehab.  Her daughter says that she is exercising at home and seems to be getting back to her baseline level of function.  She has no chest pains or other symptoms of angina.  She is on medical therapy with aspirin, Plavix (for 12 months), carvedilol , and atorvastatin.  2.  Hyperlipidemia.  She is on atorvastatin 80 mg daily with no side effects.  I will check a lipid profile and AST today.  3.  Hypertension.  She is severely hypertensive.  Her systolic blood pressure was initially about 200.  On recheck, her blood pressure was 176/101.  I am increasing her carvedilol  to 25 mg daily, I have advised medication compliance.  She should follow-up with her PCP regarding her hypertension in April Sheppard next few weeks.  An ACE or ARB should be considered given her diabetes.  Given her history of lisinopril allergy, losartan should be tried, but she should be watched carefully for reactions.  4.  Type 2 diabetes.  This is managed by her PCP.  She is on an SGLT2 inhibitor with Jardiance.   ORDERS PLACED TODAY: 1.  CMP, lipid profile   FOLLOW UP: April Sheppard patient will come back to see me in 12 months, sooner if needed.     April Sheppard. Toribio DO [facility] Interventional Cardiology      Subjective:    Reason for Consult/Visit: Follow-up [name] f/u)    History of Present Illness:[name] is a 75 y.o. female who is for follow-up appointment after a hospitalization for non-STEMI resulting in CABG.  [name] patient declined a Airline pilot.  Her daughter is present and does translation from [name] patient's native [place]  It seems like she  is recovering well.  She does not have much chest pain anymore.  She does not have anginal symptoms any longer at all.  She does not have any heart failure symptoms.  She ambulates with a cane at home and has been increasing her exercise steadily.  She has declined cardiac rehab given her language problems.  She has no edema, orthopnea, or nocturnal dyspnea.  Eating well.  No palpitations.  No lightheadedness or syncope.  No falls.  No other complaints  Medication side effects:none  [name] following portions of [name] patient's history were reviewed and updated as appropriate:  Medical History: Past Medical History:  Diagnosis Date  . CAD (coronary artery disease)   . Diabetes (HCC)   . HTN (hypertension)   . Hypothyroidism   . Non-STEMI (non-ST elevated myocardial infarction) (HCC)       Medications:  Current Outpatient Medications  Medication Sig Dispense Refill  . aspirin 81 MG chewable tablet *ANTIPLATELET* Take 1 tablet (81 mg total) by mouth daily. 30 tablet 2  . atorvastatin (LIPITOR) 80 MG tablet Take 1 tablet (80 mg total) by mouth daily at 6pm. 90 tablet 3  . clopidogreL (PLAVIX) 75 mg tablet  *ANTIPLATELET* Take 1 tablet (75 mg total) by mouth daily. Stop date: 11/2021 30 tablet 11  . empagliflozin (JARDIANCE) 25 mg Tab tablet Take 1 tablet (25 mg total) by mouth daily. 90 tablet 3  .  levothyroxine (SYNTHROID) 112 MCG tablet TAKE 1 TABLET(112 MCG) BY MOUTH DAILY AT 6 AM 90 tablet 1  . metFORMIN (GLUCOPHAGE) 1000 MG tablet Take 1 tablet (1,000 mg total) by mouth 2 times daily with meals. 180 tablet 3  . carvediloL  (COREG ) 25 MG tablet Take 1 tablet (25 mg total) by mouth 2 times daily. 180 tablet 3  . furosemide (LASIX) 40 MG tablet Take 1 tablet (40 mg total) by mouth daily for 7 days. 7 tablet 0  . potassium chloride ER (KLOR-CON-M, K-DUR) 20 MEQ extended release tablet Take 1 tablet (20 mEq total) by mouth daily for 7 days. 7 tablet 0   No current facility-administered medications  for this visit.    Allergies: Allergies  Allergen Reactions  . Lisinopril Rash (ALLERGY/intolerance) and Swelling (ALLERGY/intolerance)    Social History: Social History   Tobacco Use  Smoking Status Never Smoker  Smokeless Tobacco Never Used   Social History   Substance and Sexual Activity  Alcohol Use Not Currently   Social History   Substance and Sexual Activity  Drug Use Never    Family History: There is no family history of premature coronary artery disease in any primary relatives. Family History  Problem Relation Age of Onset  . Hypertension Mother   . No known problems  Father     Review of Systems A complete ROS was performed with pertinent positives/negatives noted in [name]. [name] remainder of [name] ROS are negative.    Objective:     Physical Exam:  VITAL SIGNS:  Blood pressure (!) 176/101, pulse 100, height 1.651 m (5' 5), weight 59.8 kg (131 lb 12.8 oz), SpO2 100 %. Wt Readings from Last 3 Encounters:  01/17/21 59.8 kg (131 lb 12.8 oz)  12/17/20 64.5 kg (142 lb 3.9 oz)  12/07/20 63.5 kg (140 lb)   Body mass index is 21.93 kg/m.  General Appearance:   Elderly woman from [place].  Normal height and weight.  Looks generally well.  Good color.    HEENT:   PERRL, EOMI.  Oropharynx is moist. No icterus.   Neck: Carotid pulses are 2+.  No bruits.  No adenopathy, thyromegaly or masses.  Jugular venous pressure is normal.   Lungs:   Respirations are unlabored. [name] lungs are clear in all fields bilaterally without rales, rhonchi or wheezing.    Percussion note is normal throughout.  [name] midline sternotomy is healing well with no erythema or evidence of disunion.   Heart:  Heart sounds are regular with a rate of approximately 70 beats per minute.  Normal S1. Normal S2.  No S3 or S4.  There are no murmurs.  There is no rub.   Abdomen:   Soft, nontender and nondistended.  There are normal bowel sounds.  No bruits.  No masses or organomegaly.     Extremities: Warm. No rashes or ulcers. No lower extremity pitting edema.   Pulses: Radial pulses are 2+ and symmetrical.   Skin: No lower extremity rashes or ulcers   Neurologic:  Alpha patient is awake, alert, and oriented to time, place, person and situation, and no strength deficits.      EKG:   Lab Review: No results for input(s): WBC, ADJUSTEDWBC, RBC, HGB, HCT, MCV, MCH, MCHC, RDW, PLT, MPV in [place] last 168 hours.. No results for input(s): NA, K, CL, BUN, CREATININE, GFR in Se[place] last 168 hours.  Invalid input(s): C02,  GLU Lab Results  Component Value Date   LDL 122 (H) 12/07/2020  HDL 58 12/07/2020   INR 1.09 12/13/2020   Lab Results  Component Value Date   HGB 13.0 12/17/2020    Lab Results  Component Value Date   CREATININE 0.68 12/17/2020   No results found for: [facility]    CVD RISK STRATIFICATION RESULTS: Lab Results  Component Value Date   CHOL 199 12/07/2020   LDL 122 (H) 12/07/2020   HDL 58 12/07/2020   TSH 41.46 (H) 10/25/2020    [place] ASCVD Risk score Verdon DC Jr., et al., 2013) failed to calculate for Marialice following reasons:   Davionne patient has a prior MI or stroke diagnosis   Electronically signed by: April Lamar Sieving, DO 01/17/21 1052

## 2023-11-05 ENCOUNTER — Emergency Department (HOSPITAL_BASED_OUTPATIENT_CLINIC_OR_DEPARTMENT_OTHER)

## 2023-11-05 ENCOUNTER — Encounter (HOSPITAL_BASED_OUTPATIENT_CLINIC_OR_DEPARTMENT_OTHER): Payer: Self-pay | Admitting: Emergency Medicine

## 2023-11-05 ENCOUNTER — Emergency Department (HOSPITAL_BASED_OUTPATIENT_CLINIC_OR_DEPARTMENT_OTHER)
Admission: EM | Admit: 2023-11-05 | Discharge: 2023-11-05 | Disposition: A | Discharge status: Home / Self Care | Attending: Emergency Medicine | Admitting: Emergency Medicine

## 2023-11-05 ENCOUNTER — Other Ambulatory Visit: Payer: Self-pay

## 2023-11-05 DIAGNOSIS — R079 Chest pain, unspecified: Secondary | ICD-10-CM | POA: Diagnosis present

## 2023-11-05 DIAGNOSIS — E119 Type 2 diabetes mellitus without complications: Secondary | ICD-10-CM | POA: Diagnosis not present

## 2023-11-05 DIAGNOSIS — I10 Essential (primary) hypertension: Secondary | ICD-10-CM

## 2023-11-05 DIAGNOSIS — Z79899 Other long term (current) drug therapy: Secondary | ICD-10-CM | POA: Diagnosis not present

## 2023-11-05 LAB — CBC
HCT: 42.6 % (ref 36.0–46.0)
Hemoglobin: 15.5 g/dL — ABNORMAL HIGH (ref 12.0–15.0)
MCH: 30.4 pg (ref 26.0–34.0)
MCHC: 36.4 g/dL — ABNORMAL HIGH (ref 30.0–36.0)
MCV: 83.5 fL (ref 80.0–100.0)
Platelets: 196 10*3/uL (ref 150–400)
RBC: 5.1 MIL/uL (ref 3.87–5.11)
RDW: 11.9 % (ref 11.5–15.5)
WBC: 11 10*3/uL — ABNORMAL HIGH (ref 4.0–10.5)
nRBC: 0 % (ref 0.0–0.2)

## 2023-11-05 LAB — TROPONIN I (HIGH SENSITIVITY)
Troponin I (High Sensitivity): 12 ng/L (ref <18)
Troponin I (High Sensitivity): 15 ng/L (ref <18)

## 2023-11-05 LAB — D-DIMER, QUANTITATIVE: D-Dimer, Quant: 0.76 ug{FEU}/mL — ABNORMAL HIGH (ref 0.00–0.50)

## 2023-11-05 LAB — BASIC METABOLIC PANEL WITH GFR
Anion gap: 14 (ref 5–15)
BUN: 19 mg/dL (ref 8–23)
CO2: 22 mmol/L (ref 22–32)
Calcium: 9.6 mg/dL (ref 8.9–10.3)
Chloride: 94 mmol/L — ABNORMAL LOW (ref 98–111)
Creatinine, Ser: 0.81 mg/dL (ref 0.44–1.00)
GFR, Estimated: >60 mL/min (ref >60)
Glucose, Bld: 307 mg/dL — ABNORMAL HIGH (ref 70–99)
Potassium: 3.4 mmol/L — ABNORMAL LOW (ref 3.5–5.1)
Sodium: 130 mmol/L — ABNORMAL LOW (ref 135–145)

## 2023-11-05 MED ORDER — LABETALOL HCL 5 MG/ML IV SOLN
10.0000 mg | Freq: Once | INTRAVENOUS | Status: AC
  Administered 2023-11-05: 10 mg via INTRAVENOUS
  Filled 2023-11-05: qty 4

## 2023-11-05 MED ORDER — CARVEDILOL 12.5 MG PO TABS
12.5000 mg | ORAL_TABLET | Freq: Once | ORAL | Status: AC
  Administered 2023-11-05: 12.5 mg via ORAL
  Filled 2023-11-05: qty 1

## 2023-11-05 MED ORDER — AMLODIPINE BESYLATE 10 MG PO TABS
10.0000 mg | ORAL_TABLET | Freq: Every day | ORAL | 0 refills | Status: AC
Start: 2023-11-05 — End: 2024-05-09

## 2023-11-05 MED ORDER — AMLODIPINE BESYLATE 5 MG PO TABS
5.0000 mg | ORAL_TABLET | Freq: Once | ORAL | Status: AC
  Administered 2023-11-05: 5 mg via ORAL
  Filled 2023-11-05: qty 1

## 2023-11-05 MED ORDER — CARVEDILOL 12.5 MG PO TABS: 12.5000 mg | ORAL_TABLET | Freq: Two times a day (BID) | ORAL | 0 refills | Status: DC

## 2023-11-05 NOTE — ED Provider Notes (Signed)
 [place] [facility] Provider Note  CSN: [id number] Arrival date & time: 11/05/23 1935  Chief Complaint(s) Chest Pain  HPI April Sheppard is a 78 y.o. female who is here today for chest pain and shortness of breath.  Patient noticed April Sheppard symptoms beginning earlier today, her daughter who is with her checked her blood pressure noted to be elevated.  Patient previously on antihypertensives, however she reportedly has not been taking them over [place] last 1 year.  Patient denies any radiation to her back.   Past Medical History Past Medical History:  Diagnosis Date   Diabetes mellitus without complication (HCC)    Hypertension    Thyroid disease    There are no active problems to display for this patient.  Home Medication(s) Prior to Admission medications   Medication Sig Start Date End Date Taking? Authorizing Provider  amLODipine (NORVASC) 10 MG tablet Take 1 tablet (10 mg total) by mouth daily. 11/05/23 12/05/23 Yes Anders Simmonds T, DO  carvedilol (COREG) 12.5 MG tablet Take 1 tablet (12.5 mg total) by mouth 2 (two) times daily with a meal. 11/05/23 12/05/23 Yes Anders Simmonds T, DO  hydrALAZINE (APRESOLINE) 25 MG tablet Take 25 mg by mouth 2 (two) times daily.     [provider]  hydrochlorothiazide (HYDRODIURIL) 25 MG tablet Take 1 tablet (25 mg total) by mouth daily. 11/24/13   Linna Hoff, MD  levothyroxine (SYNTHROID, LEVOTHROID) 75 MCG tablet Take 75 mcg by mouth daily before breakfast.    [provider]  metFORMIN (GLUCOPHAGE) 500 MG tablet Take by mouth 2 (two) times daily with a meal.    [provider]  potassium chloride (KLOR-CON) 10 MEQ CR tablet Take 10 mEq by mouth daily.      [provider]  triamterene-hydrochlorothiazide (DYAZIDE) 37.5-25 MG per capsule Take 1 capsule by mouth every morning.      [provider]                                                                                                                                     Past Surgical History History reviewed. No pertinent surgical history. Family History History reviewed. No pertinent family history.  Social History Social History   Tobacco Use   Smoking status: Never  Substance Use Topics   Alcohol use: No   Drug use: No   Allergies Lisinopril  Review of Systems Review of Systems  Physical Exam Vital Signs  I have reviewed [name] triage vital signs BP (!) 208/92   Pulse 66   Temp 98.6 F (37 C)   Resp 14   SpO2 98%   Physical Exam Vitals reviewed.  Constitutional:      Appearance: She is not toxic-appearing.  Eyes:     Pupils: Pupils are equal, round, and reactive to light.  Cardiovascular:     Rate and Rhythm: Normal rate.  Heart sounds: Normal heart sounds.  Pulmonary:     Breath sounds: Normal breath sounds.  Chest:     Chest wall: No mass or tenderness.  Abdominal:     General: Bowel sounds are normal.     Palpations: Abdomen is soft. There is no mass.  Skin:    General: Skin is warm.  Neurological:     Mental Status: She is alert.     ED Results and Treatments Labs (all labs ordered are listed, but only abnormal results are displayed) Labs Reviewed  BASIC METABOLIC PANEL WITH GFR - Abnormal; Notable for April Sheppard following components:      Result Value   Sodium 130 (*)    Potassium 3.4 (*)    Chloride 94 (*)    Glucose, Bld 307 (*)    All other components within normal limits  CBC - Abnormal; Notable for April Sheppard following components:   WBC 11.0 (*)    Hemoglobin 15.5 (*)    MCHC 36.4 (*)    All other components within normal limits  D-DIMER, QUANTITATIVE - Abnormal; Notable for April Sheppard following components:   D-Dimer, Quant 0.76 (*)    All other components within normal limits  TROPONIN I (HIGH SENSITIVITY)  TROPONIN I (HIGH SENSITIVITY)                                                                                                                          Radiology DG  Chest Portable 1 View Result Date: 11/05/2023 CLINICAL DATA:  Right-sided chest pain.  Shortness of breath. EXAM: PORTABLE CHEST 1 VIEW COMPARISON:  12/16/2020 FINDINGS: Prior median sternotomy. Chronic cardiomegaly. Unchanged mediastinal contours. Aortic atherosclerosis. No confluent airspace disease, pneumothorax or significant pleural effusion. No pulmonary edema. IMPRESSION: Chronic cardiomegaly. No acute chest findings. Electronically Signed   By: Narda Rutherford M.D.   On: 11/05/2023 22:37    Pertinent labs & imaging results that were available during my care of April Sheppard patient were reviewed by me and considered in my medical decision making (see MDM for details).  Medications Ordered in ED Medications  labetalol (NORMODYNE) injection 10 mg (10 mg Intravenous Given 11/05/23 2041)  amLODipine (NORVASC) tablet 5 mg (5 mg Oral Given 11/05/23 2159)  carvedilol (COREG) tablet 12.5 mg (12.5 mg Oral Given 11/05/23 2159)  Procedures Procedures  (including critical care time)  Medical Decision Making / ED Course   This patient presents to April Sheppard ED for concern of chest pain, this involves an extensive number of treatment options, and is a complaint that carries with it a high risk of complications and morbidity.  April Sheppard differential diagnosis includes ACS, hypertensive urgency, consider dissection, consider pulmonary embolism.  MDM: Overall, patient looks comfortable.  I think April Sheppard most likely diagnosis for this patient is hypertensive urgency.  Will begin treating her with some labetalol.  If patient appeared to be in more discomfort, was tachycardic, or was diaphoretic, would have a higher suspicion for dissection given her hypertension and report of chest pain.  However, patient overall looks quite well.  Her initial EKG does show some changes compared to prior, however does not  meet STEMI criteria.  Troponin ordered.  D-dimer ordered to risk stratify for dissection.   Reassessment 11:15 PM-patient age-adjusted D-dimer negative.  Her troponins are 12 and 15.  Patient's blood pressures come down with her resuming her previous medications.  She is not currently having any chest pain.  I am going to write patient prescriptions for hyper tension and have her follow-up with her PCP. Additional history obtained: -Additional history obtained from daughter at bedside -External records from outside source obtained and reviewed including: Chart review including previous notes, labs, imaging, consultation notes   Lab Tests: -I ordered, reviewed, and interpreted labs.   April Sheppard pertinent results include:   Labs Reviewed  BASIC METABOLIC PANEL WITH GFR - Abnormal; Notable for April Sheppard following components:      Result Value   Sodium 130 (*)    Potassium 3.4 (*)    Chloride 94 (*)    Glucose, Bld 307 (*)    All other components within normal limits  CBC - Abnormal; Notable for April Sheppard following components:   WBC 11.0 (*)    Hemoglobin 15.5 (*)    MCHC 36.4 (*)    All other components within normal limits  D-DIMER, QUANTITATIVE - Abnormal; Notable for April Sheppard following components:   D-Dimer, Quant 0.76 (*)    All other components within normal limits  TROPONIN I (HIGH SENSITIVITY)  TROPONIN I (HIGH SENSITIVITY)      EKG sinus rhythm, no ST segment depressions or elevations, no acute ischemia  EKG Interpretation Date/Time:  Monday November 05 2023 19:45:47 EDT Ventricular Rate:  84 PR Interval:  192 QRS Duration:  92 QT Interval:  408 QTC Calculation: 482 R Axis:   -40  Text Interpretation: Normal sinus rhythm Possible Left atrial enlargement Left axis deviation T wave abnormality, consider lateral ischemia Abnormal ECG When compared with ECG of 17-May-2011 12:18, PREVIOUS ECG IS PRESENT Confirmed by Anders Simmonds [id number]) on 11/05/2023 7:55:31 PM         Imaging Studies  ordered: I ordered imaging studies including chest x-ray I independently visualized and interpreted imaging. I agree with April Sheppard radiologist interpretation   Medicines ordered and prescription drug management: Meds ordered this encounter  Medications   labetalol (NORMODYNE) injection 10 mg   amLODipine (NORVASC) tablet 5 mg   carvedilol (COREG) tablet 12.5 mg   amLODipine (NORVASC) 10 MG tablet    Sig: Take 1 tablet (10 mg total) by mouth daily.    Dispense:  30 tablet    Refill:  0   carvedilol (COREG) 12.5 MG tablet    Sig: Take 1 tablet (12.5 mg total) by mouth 2 (two) times daily with a meal.  Dispense:  60 tablet    Refill:  0    -I have reviewed April Sheppard patients home medicines and have made adjustments as needed   Cardiac Monitoring: April Sheppard patient was maintained on a cardiac monitor.  I personally viewed and interpreted April Sheppard cardiac monitored which showed an underlying rhythm of: Normal sinus rhythm  Social Determinants of Health:  Factors impacting patients care include: Lack of access to primary care   Reevaluation: After April Sheppard interventions noted above, I reevaluated April Sheppard patient and found that they have :improved  Co morbidities that complicate April Sheppard patient evaluation  Past Medical History:  Diagnosis Date   Diabetes mellitus without complication (HCC)    Hypertension    Thyroid disease       Dispostion: I considered admission for this patient, however April Sheppard patient improved with management of her hypertension, she is appropriate for outpatient follow-up.     Final Clinical Impression(s) / ED Diagnoses Final diagnoses:  Primary hypertension     @PCDICTATION @    Anders Simmonds T, DO 11/05/23 2319

## 2023-11-05 NOTE — ED Triage Notes (Signed)
 Non English speaker , Right chest pain and shortness of breath today . Hx HTN . Cough started today .

## 2023-11-05 NOTE — Discharge Instructions (Addendum)
 While you were in [facility] emergency room, you had blood work to look for signs of injury to your heart.  These test were normal.  I do think that your high blood pressure was causing your symptoms today.  I am restarting you on some of your blood pressure medications.  I would like you to begin taking amlodipine 10 mg daily, carvedilol 12.5 mg 2 times per day, hydrochlorothiazide once daily.  Please call your primary care doctor tomorrow to set up a follow-up appointment for your hypertension.

## 2024-05-08 ENCOUNTER — Inpatient Hospital Stay (HOSPITAL_COMMUNITY)
Admission: EM | Admit: 2024-05-08 | Discharge: 2024-05-13 | DRG: 064 | Disposition: A | Discharge status: Skilled Nursing Facility | Attending: Internal Medicine | Admitting: Internal Medicine

## 2024-05-08 ENCOUNTER — Encounter (HOSPITAL_COMMUNITY): Payer: Self-pay

## 2024-05-08 ENCOUNTER — Observation Stay (HOSPITAL_COMMUNITY)

## 2024-05-08 ENCOUNTER — Emergency Department (HOSPITAL_COMMUNITY)

## 2024-05-08 ENCOUNTER — Other Ambulatory Visit: Payer: Self-pay

## 2024-05-08 DIAGNOSIS — R5383 Other fatigue: Secondary | ICD-10-CM | POA: Diagnosis not present

## 2024-05-08 DIAGNOSIS — Z603 Acculturation difficulty: Secondary | ICD-10-CM | POA: Diagnosis present

## 2024-05-08 DIAGNOSIS — Z951 Presence of aortocoronary bypass graft: Secondary | ICD-10-CM

## 2024-05-08 DIAGNOSIS — R680 Hypothermia, not associated with low environmental temperature: Secondary | ICD-10-CM | POA: Diagnosis present

## 2024-05-08 DIAGNOSIS — E871 Hypo-osmolality and hyponatremia: Secondary | ICD-10-CM | POA: Diagnosis present

## 2024-05-08 DIAGNOSIS — I6350 Cerebral infarction due to unspecified occlusion or stenosis of unspecified cerebral artery: Secondary | ICD-10-CM

## 2024-05-08 DIAGNOSIS — R29810 Facial weakness: Secondary | ICD-10-CM | POA: Diagnosis present

## 2024-05-08 DIAGNOSIS — Z79890 Hormone replacement therapy (postmenopausal): Secondary | ICD-10-CM

## 2024-05-08 DIAGNOSIS — I700 Atherosclerosis of aorta: Secondary | ICD-10-CM | POA: Diagnosis present

## 2024-05-08 DIAGNOSIS — R112 Nausea with vomiting, unspecified: Secondary | ICD-10-CM

## 2024-05-08 DIAGNOSIS — H02401 Unspecified ptosis of right eyelid: Secondary | ICD-10-CM | POA: Diagnosis present

## 2024-05-08 DIAGNOSIS — F05 Delirium due to known physiological condition: Secondary | ICD-10-CM | POA: Diagnosis present

## 2024-05-08 DIAGNOSIS — I10 Essential (primary) hypertension: Secondary | ICD-10-CM | POA: Diagnosis present

## 2024-05-08 DIAGNOSIS — I63533 Cerebral infarction due to unspecified occlusion or stenosis of bilateral posterior cerebral arteries: Principal | ICD-10-CM | POA: Diagnosis present

## 2024-05-08 DIAGNOSIS — Z79899 Other long term (current) drug therapy: Secondary | ICD-10-CM

## 2024-05-08 DIAGNOSIS — I639 Cerebral infarction, unspecified: Secondary | ICD-10-CM

## 2024-05-08 DIAGNOSIS — Z888 Allergy status to other drugs, medicaments and biological substances status: Secondary | ICD-10-CM

## 2024-05-08 DIAGNOSIS — E876 Hypokalemia: Secondary | ICD-10-CM | POA: Diagnosis present

## 2024-05-08 DIAGNOSIS — E039 Hypothyroidism, unspecified: Secondary | ICD-10-CM | POA: Diagnosis present

## 2024-05-08 DIAGNOSIS — N3000 Acute cystitis without hematuria: Secondary | ICD-10-CM | POA: Diagnosis present

## 2024-05-08 DIAGNOSIS — I252 Old myocardial infarction: Secondary | ICD-10-CM

## 2024-05-08 DIAGNOSIS — R338 Other retention of urine: Secondary | ICD-10-CM

## 2024-05-08 DIAGNOSIS — I2510 Atherosclerotic heart disease of native coronary artery without angina pectoris: Secondary | ICD-10-CM | POA: Diagnosis present

## 2024-05-08 DIAGNOSIS — Z7984 Long term (current) use of oral hypoglycemic drugs: Secondary | ICD-10-CM

## 2024-05-08 DIAGNOSIS — E66811 Obesity, class 1: Secondary | ICD-10-CM | POA: Diagnosis present

## 2024-05-08 DIAGNOSIS — R339 Retention of urine, unspecified: Secondary | ICD-10-CM | POA: Diagnosis present

## 2024-05-08 DIAGNOSIS — Z6830 Body mass index (BMI) 30.0-30.9, adult: Secondary | ICD-10-CM

## 2024-05-08 DIAGNOSIS — E1110 Type 2 diabetes mellitus with ketoacidosis without coma: Secondary | ICD-10-CM | POA: Diagnosis not present

## 2024-05-08 DIAGNOSIS — I6502 Occlusion and stenosis of left vertebral artery: Secondary | ICD-10-CM | POA: Diagnosis present

## 2024-05-08 DIAGNOSIS — K760 Fatty (change of) liver, not elsewhere classified: Secondary | ICD-10-CM | POA: Diagnosis present

## 2024-05-08 DIAGNOSIS — Z794 Long term (current) use of insulin: Secondary | ICD-10-CM

## 2024-05-08 DIAGNOSIS — Z7902 Long term (current) use of antithrombotics/antiplatelets: Secondary | ICD-10-CM

## 2024-05-08 DIAGNOSIS — I351 Nonrheumatic aortic (valve) insufficiency: Secondary | ICD-10-CM | POA: Diagnosis present

## 2024-05-08 DIAGNOSIS — G936 Cerebral edema: Secondary | ICD-10-CM | POA: Diagnosis present

## 2024-05-08 DIAGNOSIS — E785 Hyperlipidemia, unspecified: Secondary | ICD-10-CM | POA: Diagnosis present

## 2024-05-08 DIAGNOSIS — E860 Dehydration: Secondary | ICD-10-CM | POA: Diagnosis present

## 2024-05-08 DIAGNOSIS — I160 Hypertensive urgency: Secondary | ICD-10-CM | POA: Diagnosis present

## 2024-05-08 DIAGNOSIS — K5730 Diverticulosis of large intestine without perforation or abscess without bleeding: Secondary | ICD-10-CM | POA: Diagnosis present

## 2024-05-08 DIAGNOSIS — Z91148 Patient's other noncompliance with medication regimen for other reason: Secondary | ICD-10-CM

## 2024-05-08 DIAGNOSIS — B961 Klebsiella pneumoniae [K. pneumoniae] as the cause of diseases classified elsewhere: Secondary | ICD-10-CM | POA: Diagnosis present

## 2024-05-08 DIAGNOSIS — R29713 NIHSS score 13: Secondary | ICD-10-CM | POA: Diagnosis not present

## 2024-05-08 DIAGNOSIS — Z7982 Long term (current) use of aspirin: Secondary | ICD-10-CM

## 2024-05-08 DIAGNOSIS — R29724 NIHSS score 24: Secondary | ICD-10-CM | POA: Diagnosis present

## 2024-05-08 LAB — CBC WITH DIFFERENTIAL/PLATELET
Abs Immature Granulocytes: 0.06 K/uL (ref 0.00–0.07)
Basophils Absolute: 0.1 K/uL (ref 0.0–0.1)
Basophils Relative: 1 %
Eosinophils Absolute: 0 K/uL (ref 0.0–0.5)
Eosinophils Relative: 0 %
HCT: 50.6 % — ABNORMAL HIGH (ref 36.0–46.0)
Hemoglobin: 16.8 g/dL — ABNORMAL HIGH (ref 12.0–15.0)
Immature Granulocytes: 1 %
Lymphocytes Relative: 13 %
Lymphs Abs: 1.5 K/uL (ref 0.7–4.0)
MCH: 28.9 pg (ref 26.0–34.0)
MCHC: 33.2 g/dL (ref 30.0–36.0)
MCV: 87.1 fL (ref 80.0–100.0)
Monocytes Absolute: 0.3 K/uL (ref 0.1–1.0)
Monocytes Relative: 2 %
Neutro Abs: 10.4 K/uL — ABNORMAL HIGH (ref 1.7–7.7)
Neutrophils Relative %: 83 %
Platelets: 275 K/uL (ref 150–400)
RBC: 5.81 MIL/uL — ABNORMAL HIGH (ref 3.87–5.11)
RDW: 12.3 % (ref 11.5–15.5)
WBC: 12.4 K/uL — ABNORMAL HIGH (ref 4.0–10.5)
nRBC: 0 % (ref 0.0–0.2)

## 2024-05-08 LAB — BASIC METABOLIC PANEL WITH GFR
Anion gap: 24 — ABNORMAL HIGH (ref 5–15)
Anion gap: 13 (ref 5–15)
BUN: 32 mg/dL — ABNORMAL HIGH (ref 8–23)
BUN: 24 mg/dL — ABNORMAL HIGH (ref 8–23)
CO2: 18 mmol/L — ABNORMAL LOW (ref 22–32)
CO2: 23 mmol/L (ref 22–32)
Calcium: 9.8 mg/dL (ref 8.9–10.3)
Calcium: 9.4 mg/dL (ref 8.9–10.3)
Chloride: 97 mmol/L — ABNORMAL LOW (ref 98–111)
Chloride: 102 mmol/L (ref 98–111)
Creatinine, Ser: 0.84 mg/dL (ref 0.44–1.00)
Creatinine, Ser: 0.54 mg/dL (ref 0.44–1.00)
GFR, Estimated: >60 mL/min (ref >60)
GFR, Estimated: >60 mL/min (ref >60)
Glucose, Bld: 326 mg/dL — ABNORMAL HIGH (ref 70–99)
Glucose, Bld: 191 mg/dL — ABNORMAL HIGH (ref 70–99)
Potassium: 3.8 mmol/L (ref 3.5–5.1)
Potassium: 3.6 mmol/L (ref 3.5–5.1)
Sodium: 138 mmol/L (ref 135–145)
Sodium: 137 mmol/L (ref 135–145)

## 2024-05-08 LAB — COMPREHENSIVE METABOLIC PANEL WITH GFR
ALT: 19 U/L (ref 0–44)
AST: 22 U/L (ref 15–41)
Albumin: 4.6 g/dL (ref 3.5–5.0)
Alkaline Phosphatase: 257 U/L — ABNORMAL HIGH (ref 38–126)
Anion gap: 24 — ABNORMAL HIGH (ref 5–15)
BUN: 34 mg/dL — ABNORMAL HIGH (ref 8–23)
CO2: 16 mmol/L — ABNORMAL LOW (ref 22–32)
Calcium: 10 mg/dL (ref 8.9–10.3)
Chloride: 98 mmol/L (ref 98–111)
Creatinine, Ser: 0.72 mg/dL (ref 0.44–1.00)
GFR, Estimated: >60 mL/min (ref >60)
Glucose, Bld: 335 mg/dL — ABNORMAL HIGH (ref 70–99)
Potassium: 3.8 mmol/L (ref 3.5–5.1)
Sodium: 138 mmol/L (ref 135–145)
Total Bilirubin: 1.5 mg/dL — ABNORMAL HIGH (ref 0.0–1.2)
Total Protein: 7.7 g/dL (ref 6.5–8.1)

## 2024-05-08 LAB — CBG MONITORING, ED
Glucose-Capillary: 297 mg/dL — ABNORMAL HIGH (ref 70–99)
Glucose-Capillary: 321 mg/dL — ABNORMAL HIGH (ref 70–99)
Glucose-Capillary: 240 mg/dL — ABNORMAL HIGH (ref 70–99)
Glucose-Capillary: 176 mg/dL — ABNORMAL HIGH (ref 70–99)
Glucose-Capillary: 178 mg/dL — ABNORMAL HIGH (ref 70–99)
Glucose-Capillary: 185 mg/dL — ABNORMAL HIGH (ref 70–99)

## 2024-05-08 LAB — URINALYSIS, ROUTINE W REFLEX MICROSCOPIC
Bilirubin Urine: NEGATIVE
Glucose, UA: >=500 mg/dL — AB
Hgb urine dipstick: NEGATIVE
Ketones, ur: 80 mg/dL — AB
Nitrite: POSITIVE — AB
Protein, ur: 100 mg/dL — AB
Specific Gravity, Urine: 1.022 (ref 1.005–1.030)
pH: 5 (ref 5.0–8.0)

## 2024-05-08 LAB — BLOOD GAS, VENOUS
Acid-base deficit: 4.7 mmol/L — ABNORMAL HIGH (ref 0.0–2.0)
Bicarbonate: 20.5 mmol/L (ref 20.0–28.0)
O2 Saturation: 71 %
Patient temperature: 37
pCO2, Ven: 38 mmHg — ABNORMAL LOW (ref 44–60)
pH, Ven: 7.34 (ref 7.25–7.43)
pO2, Ven: 41 mmHg (ref 32–45)

## 2024-05-08 LAB — I-STAT CG4 LACTIC ACID, ED: Lactic Acid, Venous: 2.1 mmol/L (ref 0.5–1.9)

## 2024-05-08 LAB — TROPONIN T, HIGH SENSITIVITY
Troponin T High Sensitivity: 18 ng/L (ref 0–19)
Troponin T High Sensitivity: 19 ng/L (ref 0–19)

## 2024-05-08 LAB — OSMOLALITY: Osmolality: 330 mosm/kg (ref 275–295)

## 2024-05-08 LAB — BETA-HYDROXYBUTYRIC ACID: Beta-Hydroxybutyric Acid: 5.66 mmol/L — ABNORMAL HIGH (ref 0.05–0.27)

## 2024-05-08 LAB — LIPASE, BLOOD: Lipase: 25 U/L (ref 11–51)

## 2024-05-08 MED ORDER — ACETAMINOPHEN 650 MG RE SUPP: 650.0000 mg | Freq: Four times a day (QID) | RECTAL | Status: DC | PRN

## 2024-05-08 MED ORDER — LACTATED RINGERS IV BOLUS
750.0000 mL | Freq: Once | INTRAVENOUS | Status: AC
  Administered 2024-05-08: 750 mL via INTRAVENOUS

## 2024-05-08 MED ORDER — ONDANSETRON HCL 4 MG/2ML IJ SOLN
4.0000 mg | Freq: Once | INTRAMUSCULAR | Status: AC
  Administered 2024-05-08: 4 mg via INTRAVENOUS
  Filled 2024-05-08: qty 2

## 2024-05-08 MED ORDER — LACTATED RINGERS IV SOLN: INTRAVENOUS | Status: DC

## 2024-05-08 MED ORDER — SODIUM CHLORIDE 0.9 % IV SOLN: 1.0000 g | INTRAVENOUS | Status: DC

## 2024-05-08 MED ORDER — HYDRALAZINE HCL 20 MG/ML IJ SOLN
10.0000 mg | Freq: Once | INTRAMUSCULAR | Status: AC
  Administered 2024-05-08: 10 mg via INTRAVENOUS
  Filled 2024-05-08: qty 1

## 2024-05-08 MED ORDER — INSULIN REGULAR(HUMAN) IN NACL 100-0.9 UT/100ML-% IV SOLN
INTRAVENOUS | Status: DC
  Administered 2024-05-08: 17 [IU]/h via INTRAVENOUS
  Administered 2024-05-09: 2.6 [IU]/h via INTRAVENOUS
  Filled 2024-05-08 (×2): qty 100

## 2024-05-08 MED ORDER — IOHEXOL 300 MG/ML  SOLN
100.0000 mL | Freq: Once | INTRAMUSCULAR | Status: AC | PRN
  Administered 2024-05-08: 100 mL via INTRAVENOUS

## 2024-05-08 MED ORDER — IOHEXOL 350 MG/ML SOLN
75.0000 mL | Freq: Once | INTRAVENOUS | Status: AC | PRN
  Administered 2024-05-08: 75 mL via INTRAVENOUS

## 2024-05-08 MED ORDER — DEXTROSE IN LACTATED RINGERS 5 % IV SOLN: INTRAVENOUS | Status: AC

## 2024-05-08 MED ORDER — LACTATED RINGERS IV BOLUS
1000.0000 mL | Freq: Once | INTRAVENOUS | Status: AC
  Administered 2024-05-08: 1000 mL via INTRAVENOUS

## 2024-05-08 MED ORDER — SODIUM CHLORIDE 0.9 % IV SOLN
1.0000 g | Freq: Once | INTRAVENOUS | Status: AC
  Administered 2024-05-08: 1 g via INTRAVENOUS
  Filled 2024-05-08: qty 10

## 2024-05-08 MED ORDER — DEXTROSE 50 % IV SOLN: 0.0000 mL | INTRAVENOUS | Status: DC | PRN

## 2024-05-08 MED ORDER — ACETAMINOPHEN 325 MG PO TABS: 650.0000 mg | ORAL_TABLET | Freq: Four times a day (QID) | ORAL | Status: DC | PRN

## 2024-05-08 MED ORDER — POTASSIUM CHLORIDE 10 MEQ/100ML IV SOLN
10.0000 meq | INTRAVENOUS | Status: AC
  Administered 2024-05-08 (×2): 10 meq via INTRAVENOUS
  Filled 2024-05-08 (×2): qty 100

## 2024-05-08 MED ORDER — ENOXAPARIN SODIUM 40 MG/0.4ML IJ SOSY: 40.0000 mg | PREFILLED_SYRINGE | INTRAMUSCULAR | Status: DC

## 2024-05-08 NOTE — ED Notes (Signed)
 Pt to CT

## 2024-05-08 NOTE — H&P (Signed)
 History and Physical    April Sheppard:[id number] DOB: 1946/06/30 DOA: 05/08/2024  PCP: April Ritchie BROCKS, MD (Inactive)  Patient coming from: Home  Chief Complaint: Nausea, vomiting, generalized weakness  HPI: April Sheppard is a 78 y.o. Vietnamese-speaking female with medical history significant of CAD/NSTEMI in May 2022 status post CABG, hypertension, hyperlipidemia, type 2 diabetes, hypothyroidism presenting to [facility] ED for evaluation of nausea, vomiting, and generalized weakness.  Patient is currently somnolent but arousable.  She is oriented to self only, recognizes her granddaughter at bedside but otherwise not able to give any history.  Granddaughter states 3 days ago patient was vomiting for a day.  April Sheppard vomiting stopped yesterday but patient has continued to have generalized weakness/fatigue.  She has been very lethargic all day today and leaning toward Jennilyn right.  She has been holding food in her mouth and not swallowing it.  She could not find April Sheppard bathroom at home which is unusual.  She is not compliant with her home medications/only takes them as needed.  No facial droop or focal weakness reported.  No history of previous stroke reported.  No fevers, cough, or shortness of breath reported.  No abdominal pain or urinary symptoms reported.  ED Course: Blood pressure 242/86 on arrival.  Rectal temperature 97 F and placed on Bair hugger.  Noted to have acute urinary retention and 1500 mL urine drained via In-N-Out cath.  Labs notable for WBC count 12.4, hemoglobin 16.8 (previously 15.5 on 11/05/2023), platelet count 275k (increased from baseline), potassium 3.8, glucose 335, bicarb 16, anion gap 24, beta hydroxybutyric acid 5.66, VBG with pH 7.34 and pCO2 38, BUN 34, creatinine 0.7, alk phos 257, T. bili 1.5, transaminases normal, lipase normal.  UA with >500 glucose, 80 ketones, 100 protein, positive nitrite, small amount of leukocytes, and microscopy showing 0-5 WBCs and rare bacteria.  Chest x-ray showing  no active cardiopulmonary disease.  CT abdomen pelvis showing hepatic steatosis, colonic diverticulosis, aortic atherosclerosis, and no acute findings.  CT head showing questionable subacute bilateral, right greater than left, cerebral infarctions.  No acute intracranial hemorrhage seen on CT. ED PA discussed [name] case with neurologist Dr. Vanessa who recommended obtaining brain MRI and CTA head and neck for further evaluation.  EKG showing sinus rhythm, T wave inversions in lateral leads appear new compared to previous EKG from April 2025.  Troponin negative x 2.  Patient was given IV hydralazine, Zofran, ceftriaxone, 1.75 L IV fluid boluses, IV potassium and started on insulin drip and IV fluids per DKA protocol in [name].   Review of Systems:  Review of Systems  All other systems reviewed and are negative.   Past Medical History:  Diagnosis Date   Diabetes mellitus without complication (HCC)    Hypertension    Thyroid disease     History reviewed. No pertinent surgical history.   reports that she has never smoked. She does not have any smokeless tobacco history on file. She reports that she does not drink alcohol and does not use drugs.  Allergies  Allergen Reactions   Lisinopril Swelling and Rash    History reviewed. No pertinent family history.  Prior to Admission medications   Medication Sig Start Date End Date Taking? Authorizing Provider  amLODipine  (NORVASC ) 10 MG tablet Take 1 tablet (10 mg total) by mouth daily. 11/05/23 12/05/23  [name] T, DO  carvedilol  (COREG ) 12.5 MG tablet Take 1 tablet (12.5 mg total) by mouth 2 (two) times daily with a meal. 11/05/23  12/05/23  [name] T, DO  hydrALAZINE (APRESOLINE) 25 MG tablet Take 25 mg by mouth 2 (two) times daily.     [provider]  hydrochlorothiazide  (HYDRODIURIL ) 25 MG tablet Take 1 tablet (25 mg total) by mouth daily. 11/24/13   [name], [name], MD  levothyroxine (SYNTHROID, LEVOTHROID) 75 MCG tablet Take  75 mcg by mouth daily before breakfast.    [provider]  metFORMIN (GLUCOPHAGE) 500 MG tablet Take by mouth 2 (two) times daily with a meal.    [provider]  potassium chloride (KLOR-CON) 10 MEQ CR tablet Take 10 mEq by mouth daily.      [provider]  triamterene-hydrochlorothiazide  (DYAZIDE) 37.5-25 MG per capsule Take 1 capsule by mouth every morning.      [provider]    Physical Exam: Vitals:   05/08/24 1900 05/08/24 1922 05/08/24 1939 05/08/24 2108  BP: (!) 194/93   (!) 198/72  Pulse: 73   61  Resp: 13   14  Temp:  97.7 F (36.5 C) (!) 97 F (36.1 C)   TempSrc:  Axillary Rectal   SpO2: 100%   100%    Physical Exam Vitals reviewed.  Constitutional:      General: She is not in acute distress. HENT:     Head: Normocephalic and atraumatic.  Eyes:     Extraocular Movements: Extraocular movements intact.  Cardiovascular:     Rate and Rhythm: Normal rate and regular rhythm.     Pulses: Normal pulses.  Pulmonary:     Effort: Pulmonary effort is normal. No respiratory distress.     Breath sounds: Normal breath sounds.  Abdominal:     General: Bowel sounds are normal.     Palpations: Abdomen is soft.     Tenderness: There is no abdominal tenderness. There is no guarding.  Musculoskeletal:     Right lower leg: No edema.     Left lower leg: No edema.  Skin:    General: Skin is warm and dry.  Neurological:     General: No focal deficit present.     Mental Status: She is alert.     Cranial Nerves: No cranial nerve deficit.     Sensory: No sensory deficit.     Motor: No weakness.     Labs on Admission: I have personally reviewed following labs and imaging studies  CBC: Recent Labs  Lab 05/08/24 1458  WBC 12.4*  NEUTROABS 10.4*  HGB 16.8*  HCT 50.6*  MCV 87.1  PLT 275   Basic Metabolic Panel: Recent Labs  Lab 05/08/24 1458 05/08/24 1716  NA 138 138  K 3.8 3.8  CL 98 97*  CO2 16* 18*  GLUCOSE 335* 326*  BUN  34* 32*  CREATININE 0.72 0.84  CALCIUM 10.0 9.8   GFR: CrCl cannot be calculated (Unknown ideal weight.). Liver Function Tests: Recent Labs  Lab 05/08/24 1458  AST 22  ALT 19  ALKPHOS 257*  BILITOT 1.5*  PROT 7.7  ALBUMIN 4.6   Recent Labs  Lab 05/08/24 1516  LIPASE 25   No results for input(s): AMMONIA in Merla last 168 hours. Coagulation Profile: No results for input(s): INR, PROTIME in Zoila last 168 hours. Cardiac Enzymes: No results for input(s): CKTOTAL, CKMB, CKMBINDEX, TROPONINI in Mireille last 168 hours. BNP (last 3 results) No results for input(s): PROBNP in Fredrika last 8760 hours. HbA1C: No results for input(s): HGBA1C in Jeyli last 72 hours. CBG: Recent Labs  Lab 05/08/24  1446 05/08/24 1724 05/08/24 1913 05/08/24 2052  GLUCAP 297* 321* 240* 176*   Lipid Profile: No results for input(s): CHOL, HDL, LDLCALC, TRIG, CHOLHDL, LDLDIRECT in Lyana last 72 hours. Thyroid Function Tests: No results for input(s): TSH, T4TOTAL, FREET4, T3FREE, THYROIDAB in Clotine last 72 hours. Anemia Panel: No results for input(s): VITAMINB12, FOLATE, FERRITIN, TIBC, IRON, RETICCTPCT in Aariel last 72 hours. Urine analysis:    Component Value Date/Time   COLORURINE YELLOW 05/08/2024 1924   APPEARANCEUR CLEAR 05/08/2024 1924   LABSPEC 1.022 05/08/2024 1924   PHURINE 5.0 05/08/2024 1924   GLUCOSEU >=500 (A) 05/08/2024 1924   HGBUR NEGATIVE 05/08/2024 1924   BILIRUBINUR NEGATIVE 05/08/2024 1924   KETONESUR 80 (A) 05/08/2024 1924   PROTEINUR 100 (A) 05/08/2024 1924   UROBILINOGEN 0.2 06/09/2010 1539   NITRITE POSITIVE (A) 05/08/2024 1924   LEUKOCYTESUR SMALL (A) 05/08/2024 1924    Radiological Exams on Admission: CT Head Wo Contrast Addendum Date: 05/08/2024 ADDENDUM REPORT: 05/08/2024 19:20 ADDENDUM: These results were called by telephone at [place] time of interpretation on 05/08/2024 at 7:20 pm to provider RILEY RANSOM , who verbally  acknowledged these results. Electronically Signed   By: Morgane  Naveau M.D.   On: 05/08/2024 19:20   Result Date: 05/08/2024 CLINICAL DATA:  Headache, new onset (Age >= 51y) EXAM: CT HEAD WITHOUT CONTRAST TECHNIQUE: Contiguous axial images were obtained from Tangi base of Tomeka skull through Joselynne vertex without intravenous contrast. RADIATION DOSE REDUCTION: This exam was performed according to Makenlee departmental dose-optimization program which includes automated exposure control, adjustment of Amal mA and/or kV according to patient size and/or use of iterative reconstruction technique. COMPARISON:  CT head 10/03/2021 FINDINGS: Brain: Interval development of loss of gray-white matter differentiation with associated vasogenic edema of Delona bilateral cerebellar hemispheres, right greater than left. Underlying chronic bilateral cerebellar infarctions. Left frontal and right occipital encephalomalacia. Patchy and confluent areas of decreased attenuation are noted throughout Almer deep and periventricular white matter of Kimmy cerebral hemispheres bilaterally, compatible with chronic microvascular ischemic disease. No parenchymal hemorrhage. No mass lesion. No extra-axial collection. No mass effect or midline shift. No hydrocephalus. Basilar cisterns are patent. Vascular: No hyperdense vessel. Atherosclerotic calcifications are present within Khalia cavernous internal carotid and vertebral arteries. Skull: No acute fracture or focal lesion. Sinuses/Orbits: Paranasal sinuses and mastoid air cells are clear. Ivery orbits are unremarkable. Other: None. IMPRESSION: 1. 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 Recommend MRI without contrast for further evaluation. 2. No acute intracranial hemorrhage. Electronically Signed: By: Morgane  Naveau M.D. On: 05/08/2024 19:17   CT ABDOMEN PELVIS W CONTRAST Result Date: 05/08/2024 CLINICAL DATA:  LLQ abdominal pain EXAM: CT ABDOMEN AND PELVIS WITH  CONTRAST TECHNIQUE: Multidetector CT imaging of Emony abdomen and pelvis was performed using Lisandra standard protocol following bolus administration of intravenous contrast. RADIATION DOSE REDUCTION: This exam was performed according to Hooria departmental dose-optimization program which includes automated exposure control, adjustment of Camille mA and/or kV according to patient size and/or use of iterative reconstruction technique. CONTRAST:  100mL OMNIPAQUE IOHEXOL 300 MG/ML  SOLN COMPARISON:  None Available. FINDINGS: Lower chest: Coronary artery calcification. Mitral annular calcification. Aortic valve leaflet calcification. Atherosclerotic plaque. Hepatobiliary: Margrete hepatic parenchyma is diffusely hypodense compared to Freyja splenic parenchyma consistent with fatty infiltration. No focal liver abnormality. No gallstones, gallbladder wall thickening, or pericholecystic fluid. No biliary dilatation. Pancreas: No focal lesion. Normal pancreatic contour. No surrounding inflammatory changes. No main pancreatic ductal dilatation. Spleen: Normal in size  without focal abnormality.  Splenule noted Adrenals/Urinary Tract: No adrenal nodule bilaterally. Bilateral kidneys enhance symmetrically. No hydronephrosis. No hydroureter. Kiyona urinary bladder is distended with urine. On delayed imaging, there is no urothelial wall thickening and there are no filling defects in Monita opacified portions of Gerardine bilateral collecting systems or ureters. Stomach/Bowel: Stomach is within normal limits. No evidence of bowel wall thickening or dilatation. Colonic diverticulosis. Appendix appears normal. Vascular/Lymphatic: No abdominal aorta or iliac aneurysm. Severe atherosclerotic plaque of Annetta aorta and its branches. No abdominal, pelvic, or inguinal lymphadenopathy. Reproductive: Uterus and bilateral adnexa are unremarkable. Other: No intraperitoneal free fluid. No intraperitoneal free gas. No organized fluid collection. Musculoskeletal: No abdominal  wall hernia or abnormality. Diffusely decreased bone density. No suspicious lytic or blastic osseous lesions. No acute displaced fracture. Multilevel degenerative changes of Korryn spine. IMPRESSION: 1. No acute intra-abdominal or intrapelvic abnormality. 2. Hepatic steatosis. 3. Colonic diverticulosis. 4. Aortic Atherosclerosis (ICD10-I70.0) including mitral annular, coronary artery, aortic valve leaflet calcifications-correlate for aortic stenosis. Electronically Signed   By: Morgane  Naveau M.D.   On: 05/08/2024 19:12   DG Chest 2 View Result Date: 05/08/2024 CLINICAL DATA:  Hypertension. Hyperglycemia. Unwell feeling over Kymber past few days. Dizziness and weakness. Vomiting. EXAM: CHEST - 2 VIEW COMPARISON:  11/05/2023 FINDINGS: Postoperative changes in Domingue mediastinum. Heart size and pulmonary vascularity are normal for technique. Lungs are clear. No pleural effusion or pneumothorax. Mediastinal contours appear intact. Calcification of Valene aorta. Degenerative changes in Tahlor spine. IMPRESSION: No active cardiopulmonary disease. Electronically Signed   By: Elsie Gravely M.D.   On: 05/08/2024 16:51    Assessment and Plan  DKA Patient with history of type 2 diabetes on metformin but noncompliant with home meds.  Last hemoglobin A1c unknown.  Initial labs showing glucose 335, bicarb 16, anion gap 24, beta hydroxybutyric acid 5.66, UA with 80 ketones, VBG with pH 7.34.  Keep n.p.o. Continue insulin drip and IV fluids per DKA protocol.  Frequent CBG checks per Endo tool.  Monitor BMP and beta hydroxybutyric acid level every 4 hours.  Check hemoglobin A1c.  Acute encephalopathy/CVA CT head showing questionable subacute bilateral, right greater than left, cerebral infarctions.  No acute intracranial hemorrhage seen on CT. Brain MRI and CTA head and neck ordered for further evaluation per neurology recommendation.  Given no [facility] availability at [facility], patient will be admitted to [facility].  ?UTI/ sepsis Patient with rectal temperature 97 F and placed on Bair hugger in [facility] ED.  WBC count 12.4 but dehydration/hemoconcentration could also be contributing as hemoglobin and platelet count are also up from baseline.  No tachycardia, tachypnea, or hypotension. UA with positive nitrite, small amount of leukocytes, and microscopy showing 0-5 WBCs and rare bacteria.  Per granddaughter, patient has not endorsed any urinary symptoms.  Patient was given a dose of ceftriaxone in [facility] ED.  Continue antibiotic for now and follow-up urine and blood cultures.  Check lactate and monitor CBC.  Hypertensive urgency In [facility] setting of medication noncompliance.  Blood pressure 242/86 on arrival and patient was given IV hydralazine in [facility] ED.  Most recent SBP in [place] 170s.  Will avoid antihypertensive meds at this time to allow permissive hypertension given concern for stroke on head imaging/pending workup as above.    Acute urinary retention In-N-Out cath done in [facility] ED and 1500 mL urine drained.  Continue bladder scans every 4 hours for now.  Elevated LFTs Alk phos 257 and T. bili 1.5,  transaminases normal.  No longer having any nausea or vomiting.  CT showing hepatic steatosis but no gallstones or biliary dilatation.  Monitor LFTs.  Abnormal CT finding CT showing aortic atherosclerosis including mitral annular, coronary artery, and aortic valve leaflet calcifications concerning for possible aortic stenosis.  Echocardiogram ordered.  CAD status post CABG:   EKG showing T wave inversions in lateral leads which appear new compared to previous EKG from April 2025.  However, ACS less likely as patient is not endorsing chest pain and troponin negative x 2.  Echocardiogram ordered as above. Hyperlipidemia Hypothyroidism: Check TSH. Patient is confused.  Granddaughter does not seem to know which medications Genae patient takes at home.  Awaiting pharmacy med rec.  DVT prophylaxis: Lovenox Code Status:  Full Code (discussed with Yun patient's granddaughter) Family Communication: Granddaughter at bedside. Level of care: Progressive Care Unit Admission status: It is my clinical opinion that referral for OBSERVATION is reasonable and necessary in this patient based on Evon above information provided. Kimmie aforementioned taken together are felt to place Channing patient at high risk for further clinical deterioration. However, it is anticipated that Khristi patient may be medically stable for discharge from [facility] hospital within 24 to 48 hours.  Editha Ram MD [facility]  If 7PM-7AM, please contact night-coverage [email or web address]  05/08/2024, 9:47 PM

## 2024-05-08 NOTE — ED Triage Notes (Addendum)
 Pt BIB EMS from home with reports of hyperglycemia. Pt has not been feeling well Sonjia past few days (dizziness and weakness). Pt is noncompliant with medications and does not check her glucose at home. Pt has vomiting.

## 2024-05-09 ENCOUNTER — Observation Stay (HOSPITAL_COMMUNITY)

## 2024-05-09 ENCOUNTER — Inpatient Hospital Stay (HOSPITAL_COMMUNITY)

## 2024-05-09 DIAGNOSIS — R112 Nausea with vomiting, unspecified: Secondary | ICD-10-CM | POA: Diagnosis not present

## 2024-05-09 DIAGNOSIS — E876 Hypokalemia: Secondary | ICD-10-CM | POA: Diagnosis present

## 2024-05-09 DIAGNOSIS — I350 Nonrheumatic aortic (valve) stenosis: Secondary | ICD-10-CM | POA: Diagnosis not present

## 2024-05-09 DIAGNOSIS — F05 Delirium due to known physiological condition: Secondary | ICD-10-CM | POA: Diagnosis present

## 2024-05-09 DIAGNOSIS — G936 Cerebral edema: Secondary | ICD-10-CM | POA: Diagnosis present

## 2024-05-09 DIAGNOSIS — I2510 Atherosclerotic heart disease of native coronary artery without angina pectoris: Secondary | ICD-10-CM | POA: Diagnosis present

## 2024-05-09 DIAGNOSIS — I6350 Cerebral infarction due to unspecified occlusion or stenosis of unspecified cerebral artery: Secondary | ICD-10-CM | POA: Diagnosis not present

## 2024-05-09 DIAGNOSIS — B961 Klebsiella pneumoniae [K. pneumoniae] as the cause of diseases classified elsewhere: Secondary | ICD-10-CM | POA: Diagnosis present

## 2024-05-09 DIAGNOSIS — R29710 NIHSS score 10: Secondary | ICD-10-CM

## 2024-05-09 DIAGNOSIS — R5383 Other fatigue: Secondary | ICD-10-CM | POA: Diagnosis present

## 2024-05-09 DIAGNOSIS — I10 Essential (primary) hypertension: Secondary | ICD-10-CM | POA: Diagnosis present

## 2024-05-09 DIAGNOSIS — I639 Cerebral infarction, unspecified: Secondary | ICD-10-CM | POA: Diagnosis not present

## 2024-05-09 DIAGNOSIS — E860 Dehydration: Secondary | ICD-10-CM | POA: Diagnosis present

## 2024-05-09 DIAGNOSIS — I160 Hypertensive urgency: Secondary | ICD-10-CM | POA: Diagnosis present

## 2024-05-09 DIAGNOSIS — E1151 Type 2 diabetes mellitus with diabetic peripheral angiopathy without gangrene: Secondary | ICD-10-CM | POA: Diagnosis not present

## 2024-05-09 DIAGNOSIS — R29724 NIHSS score 24: Secondary | ICD-10-CM | POA: Diagnosis present

## 2024-05-09 DIAGNOSIS — I351 Nonrheumatic aortic (valve) insufficiency: Secondary | ICD-10-CM | POA: Diagnosis present

## 2024-05-09 DIAGNOSIS — I6502 Occlusion and stenosis of left vertebral artery: Secondary | ICD-10-CM | POA: Diagnosis present

## 2024-05-09 DIAGNOSIS — E039 Hypothyroidism, unspecified: Secondary | ICD-10-CM | POA: Diagnosis present

## 2024-05-09 DIAGNOSIS — K760 Fatty (change of) liver, not elsewhere classified: Secondary | ICD-10-CM | POA: Diagnosis present

## 2024-05-09 DIAGNOSIS — R29713 NIHSS score 13: Secondary | ICD-10-CM | POA: Diagnosis not present

## 2024-05-09 DIAGNOSIS — E785 Hyperlipidemia, unspecified: Secondary | ICD-10-CM | POA: Diagnosis present

## 2024-05-09 DIAGNOSIS — I700 Atherosclerosis of aorta: Secondary | ICD-10-CM | POA: Diagnosis present

## 2024-05-09 DIAGNOSIS — E66811 Obesity, class 1: Secondary | ICD-10-CM | POA: Diagnosis present

## 2024-05-09 DIAGNOSIS — I7090 Unspecified atherosclerosis: Secondary | ICD-10-CM

## 2024-05-09 DIAGNOSIS — E871 Hypo-osmolality and hyponatremia: Secondary | ICD-10-CM | POA: Diagnosis present

## 2024-05-09 DIAGNOSIS — I63533 Cerebral infarction due to unspecified occlusion or stenosis of bilateral posterior cerebral arteries: Secondary | ICD-10-CM | POA: Diagnosis present

## 2024-05-09 DIAGNOSIS — I63543 Cerebral infarction due to unspecified occlusion or stenosis of bilateral cerebellar arteries: Secondary | ICD-10-CM | POA: Diagnosis not present

## 2024-05-09 DIAGNOSIS — I6389 Other cerebral infarction: Secondary | ICD-10-CM | POA: Diagnosis not present

## 2024-05-09 DIAGNOSIS — R680 Hypothermia, not associated with low environmental temperature: Secondary | ICD-10-CM | POA: Diagnosis present

## 2024-05-09 DIAGNOSIS — N3000 Acute cystitis without hematuria: Secondary | ICD-10-CM | POA: Diagnosis present

## 2024-05-09 DIAGNOSIS — Z794 Long term (current) use of insulin: Secondary | ICD-10-CM | POA: Diagnosis not present

## 2024-05-09 DIAGNOSIS — E1110 Type 2 diabetes mellitus with ketoacidosis without coma: Secondary | ICD-10-CM | POA: Diagnosis present

## 2024-05-09 LAB — CBC
HCT: 40.1 % (ref 36.0–46.0)
Hemoglobin: 14 g/dL (ref 12.0–15.0)
MCH: 29.7 pg (ref 26.0–34.0)
MCHC: 34.9 g/dL (ref 30.0–36.0)
MCV: 85.1 fL (ref 80.0–100.0)
Platelets: 235 K/uL (ref 150–400)
RBC: 4.71 MIL/uL (ref 3.87–5.11)
RDW: 12.2 % (ref 11.5–15.5)
WBC: 16.4 K/uL — ABNORMAL HIGH (ref 4.0–10.5)
nRBC: 0 % (ref 0.0–0.2)

## 2024-05-09 LAB — TSH: TSH: 8.07 u[IU]/mL — ABNORMAL HIGH (ref 0.350–4.500)

## 2024-05-09 LAB — BASIC METABOLIC PANEL WITH GFR
Anion gap: 11 (ref 5–15)
Anion gap: 10 (ref 5–15)
Anion gap: 13 (ref 5–15)
BUN: 20 mg/dL (ref 8–23)
BUN: 14 mg/dL (ref 8–23)
BUN: 18 mg/dL (ref 8–23)
CO2: 22 mmol/L (ref 22–32)
CO2: 18 mmol/L — ABNORMAL LOW (ref 22–32)
CO2: 22 mmol/L (ref 22–32)
Calcium: 8.5 mg/dL — ABNORMAL LOW (ref 8.9–10.3)
Calcium: 6.7 mg/dL — ABNORMAL LOW (ref 8.9–10.3)
Calcium: 9 mg/dL (ref 8.9–10.3)
Chloride: 105 mmol/L (ref 98–111)
Chloride: 113 mmol/L — ABNORMAL HIGH (ref 98–111)
Chloride: 100 mmol/L (ref 98–111)
Creatinine, Ser: 0.54 mg/dL (ref 0.44–1.00)
Creatinine, Ser: 0.37 mg/dL — ABNORMAL LOW (ref 0.44–1.00)
Creatinine, Ser: 0.53 mg/dL (ref 0.44–1.00)
GFR, Estimated: >60 mL/min (ref >60)
GFR, Estimated: >60 mL/min (ref >60)
GFR, Estimated: >60 mL/min (ref >60)
Glucose, Bld: 219 mg/dL — ABNORMAL HIGH (ref 70–99)
Glucose, Bld: 101 mg/dL — ABNORMAL HIGH (ref 70–99)
Glucose, Bld: 167 mg/dL — ABNORMAL HIGH (ref 70–99)
Potassium: 3 mmol/L — ABNORMAL LOW (ref 3.5–5.1)
Potassium: 2.5 mmol/L — CL (ref 3.5–5.1)
Potassium: 3.4 mmol/L — ABNORMAL LOW (ref 3.5–5.1)
Sodium: 138 mmol/L (ref 135–145)
Sodium: 141 mmol/L (ref 135–145)
Sodium: 135 mmol/L (ref 135–145)

## 2024-05-09 LAB — BETA-HYDROXYBUTYRIC ACID
Beta-Hydroxybutyric Acid: 1.07 mmol/L — ABNORMAL HIGH (ref 0.05–0.27)
Beta-Hydroxybutyric Acid: <0.05 mmol/L — ABNORMAL LOW (ref 0.05–0.27)
Beta-Hydroxybutyric Acid: 0.35 mmol/L — ABNORMAL HIGH (ref 0.05–0.27)

## 2024-05-09 LAB — CBG MONITORING, ED
Glucose-Capillary: 127 mg/dL — ABNORMAL HIGH (ref 70–99)
Glucose-Capillary: 136 mg/dL — ABNORMAL HIGH (ref 70–99)
Glucose-Capillary: 136 mg/dL — ABNORMAL HIGH (ref 70–99)
Glucose-Capillary: 149 mg/dL — ABNORMAL HIGH (ref 70–99)
Glucose-Capillary: 159 mg/dL — ABNORMAL HIGH (ref 70–99)
Glucose-Capillary: 164 mg/dL — ABNORMAL HIGH (ref 70–99)
Glucose-Capillary: 165 mg/dL — ABNORMAL HIGH (ref 70–99)
Glucose-Capillary: 166 mg/dL — ABNORMAL HIGH (ref 70–99)
Glucose-Capillary: 167 mg/dL — ABNORMAL HIGH (ref 70–99)
Glucose-Capillary: 178 mg/dL — ABNORMAL HIGH (ref 70–99)
Glucose-Capillary: 207 mg/dL — ABNORMAL HIGH (ref 70–99)
Glucose-Capillary: 90 mg/dL (ref 70–99)

## 2024-05-09 LAB — HEPATIC FUNCTION PANEL
ALT: 5 U/L (ref 0–44)
AST: <10 U/L — ABNORMAL LOW (ref 15–41)
Albumin: <1.5 g/dL — ABNORMAL LOW (ref 3.5–5.0)
Alkaline Phosphatase: 67 U/L (ref 38–126)
Bilirubin, Direct: 0.1 mg/dL (ref 0.0–0.2)
Indirect Bilirubin: 0.2 mg/dL — ABNORMAL LOW (ref 0.3–0.9)
Total Bilirubin: 0.3 mg/dL (ref 0.0–1.2)
Total Protein: <3 g/dL — ABNORMAL LOW (ref 6.5–8.1)

## 2024-05-09 LAB — ECHOCARDIOGRAM COMPLETE
AR max vel: 2.36 cm2
AV Area VTI: 2.38 cm2
AV Area mean vel: 2.34 cm2
AV Mean grad: 6.8 mmHg
AV Peak grad: 12.8 mmHg
Ao pk vel: 1.79 m/s
Area-P 1/2: 2.76 cm2
Calc EF: 57.5 %
P 1/2 time: 419 ms
S' Lateral: 2.7 cm
Single Plane A2C EF: 59.1 %
Single Plane A4C EF: 58 %

## 2024-05-09 LAB — HEMOGLOBIN A1C
Hgb A1c MFr Bld: 13.2 % — ABNORMAL HIGH (ref 4.8–5.6)
Mean Plasma Glucose: 332.14 mg/dL

## 2024-05-09 LAB — GLUCOSE, CAPILLARY: Glucose-Capillary: 136 mg/dL — ABNORMAL HIGH (ref 70–99)

## 2024-05-09 MED ORDER — ENOXAPARIN SODIUM 40 MG/0.4ML IJ SOSY: 40.0000 mg | PREFILLED_SYRINGE | INTRAMUSCULAR | Status: DC

## 2024-05-09 MED ORDER — POTASSIUM CHLORIDE 10 MEQ/100ML IV SOLN
10.0000 meq | INTRAVENOUS | Status: AC
  Administered 2024-05-09 (×4): 10 meq via INTRAVENOUS
  Filled 2024-05-09 (×4): qty 100

## 2024-05-09 MED ORDER — POTASSIUM CHLORIDE 10 MEQ/100ML IV SOLN
10.0000 meq | INTRAVENOUS | Status: AC
  Administered 2024-05-09 (×3): 10 meq via INTRAVENOUS
  Filled 2024-05-09 (×3): qty 100

## 2024-05-09 MED ORDER — HYDRALAZINE HCL 25 MG PO TABS: 25.0000 mg | ORAL_TABLET | Freq: Two times a day (BID) | ORAL | Status: DC

## 2024-05-09 MED ORDER — INSULIN ASPART 100 UNIT/ML IJ SOLN
0.0000 [IU] | Freq: Every day | INTRAMUSCULAR | Status: DC
  Administered 2024-05-10: 3 [IU] via SUBCUTANEOUS
  Administered 2024-05-11: 4 [IU] via SUBCUTANEOUS
  Filled 2024-05-09: qty 0.05

## 2024-05-09 MED ORDER — ASPIRIN 81 MG PO TBEC: 81.0000 mg | DELAYED_RELEASE_TABLET | Freq: Every day | ORAL | Status: DC

## 2024-05-09 MED ORDER — AMLODIPINE BESYLATE 5 MG PO TABS: 10.0000 mg | ORAL_TABLET | Freq: Every day | ORAL | Status: DC

## 2024-05-09 MED ORDER — ASPIRIN 325 MG PO TABS
325.0000 mg | ORAL_TABLET | Freq: Once | ORAL | Status: AC
  Administered 2024-05-09: 325 mg via ORAL
  Filled 2024-05-09: qty 1

## 2024-05-09 MED ORDER — INSULIN ASPART 100 UNIT/ML IJ SOLN
3.0000 [IU] | Freq: Three times a day (TID) | INTRAMUSCULAR | Status: DC
  Filled 2024-05-09: qty 0.03

## 2024-05-09 MED ORDER — POTASSIUM CHLORIDE ER 10 MEQ PO TBCR: 40.0000 meq | EXTENDED_RELEASE_TABLET | Freq: Once | ORAL | Status: DC

## 2024-05-09 MED ORDER — INSULIN ASPART 100 UNIT/ML IJ SOLN
0.0000 [IU] | Freq: Three times a day (TID) | INTRAMUSCULAR | Status: DC
  Filled 2024-05-09: qty 0.09

## 2024-05-09 MED ORDER — LEVOTHYROXINE SODIUM 100 MCG PO TABS
100.0000 ug | ORAL_TABLET | Freq: Every day | ORAL | Status: DC
  Administered 2024-05-11 – 2024-05-13 (×3): 100 ug via ORAL
  Filled 2024-05-09 (×3): qty 1

## 2024-05-09 MED ORDER — INSULIN ASPART 100 UNIT/ML IJ SOLN
0.0000 [IU] | INTRAMUSCULAR | Status: DC
  Administered 2024-05-09: 1 [IU] via SUBCUTANEOUS
  Administered 2024-05-09: 2 [IU] via SUBCUTANEOUS
  Administered 2024-05-10: 3 [IU] via SUBCUTANEOUS
  Administered 2024-05-10: 7 [IU] via SUBCUTANEOUS
  Administered 2024-05-10: 1 [IU] via SUBCUTANEOUS
  Administered 2024-05-11 (×2): 3 [IU] via SUBCUTANEOUS
  Administered 2024-05-11: 9 [IU] via SUBCUTANEOUS
  Administered 2024-05-11 (×2): 2 [IU] via SUBCUTANEOUS
  Administered 2024-05-11: 5 [IU] via SUBCUTANEOUS
  Administered 2024-05-12: 3 [IU] via SUBCUTANEOUS
  Administered 2024-05-12: 1 [IU] via SUBCUTANEOUS
  Administered 2024-05-12: 3 [IU] via SUBCUTANEOUS
  Administered 2024-05-12: 2 [IU] via SUBCUTANEOUS
  Administered 2024-05-13: 5 [IU] via SUBCUTANEOUS
  Administered 2024-05-13: 1 [IU] via SUBCUTANEOUS
  Filled 2024-05-09: qty 0.09

## 2024-05-09 MED ORDER — HYDRALAZINE HCL 20 MG/ML IJ SOLN
5.0000 mg | INTRAMUSCULAR | Status: DC | PRN
  Administered 2024-05-09 – 2024-05-10 (×2): 5 mg via INTRAVENOUS
  Filled 2024-05-09 (×2): qty 1

## 2024-05-09 MED ORDER — INSULIN GLARGINE-YFGN 100 UNIT/ML ~~LOC~~ SOLN
5.0000 [IU] | SUBCUTANEOUS | Status: DC
  Filled 2024-05-09: qty 0.05

## 2024-05-09 MED ORDER — CLOPIDOGREL BISULFATE 75 MG PO TABS
75.0000 mg | ORAL_TABLET | Freq: Every day | ORAL | Status: DC
  Administered 2024-05-10 – 2024-05-13 (×4): 75 mg via ORAL
  Filled 2024-05-09 (×4): qty 1

## 2024-05-09 MED ORDER — ASPIRIN 300 MG RE SUPP
300.0000 mg | Freq: Every day | RECTAL | Status: DC
  Administered 2024-05-10: 300 mg via RECTAL
  Filled 2024-05-09: qty 1

## 2024-05-09 MED ORDER — INSULIN GLARGINE 100 UNIT/ML ~~LOC~~ SOLN
5.0000 [IU] | SUBCUTANEOUS | Status: DC
  Administered 2024-05-09 – 2024-05-13 (×5): 5 [IU] via SUBCUTANEOUS
  Filled 2024-05-09 (×5): qty 0.05

## 2024-05-09 MED ORDER — CLOPIDOGREL BISULFATE 300 MG PO TABS
300.0000 mg | ORAL_TABLET | Freq: Once | ORAL | Status: AC
  Administered 2024-05-09: 300 mg via ORAL
  Filled 2024-05-09: qty 1

## 2024-05-09 MED ORDER — CARVEDILOL 12.5 MG PO TABS: 12.5000 mg | ORAL_TABLET | Freq: Two times a day (BID) | ORAL | Status: DC

## 2024-05-09 NOTE — Inpatient Diabetes Management (Addendum)
 Inpatient Diabetes Program Recommendations  AACE/ADA: New Consensus Statement on Inpatient Glycemic Control (2015)  Target Ranges:  Prepandial:   less than 140 mg/dL      Peak postprandial:   less than 180 mg/dL (1-2 hours)      Critically ill patients:  140 - 180 mg/dL   Lab Results  Component Value Date   GLUCAP 127 (H) 05/09/2024   HGBA1C 13.2 (H) 05/08/2024    Review of Glycemic Control  Latest Reference Range & Units 05/09/24 03:56 05/09/24 05:20 05/09/24 07:50 05/09/24 09:57  Glucose-Capillary 70 - 99 mg/dL 821 (H) 863 (H) 834 (H) 127 (H)  (H): Data is abnormally high  Diabetes history: DM2 Outpatient Diabetes medications: Metformin 1000 mg BID Current orders for Inpatient glycemic control: IV insulin-transitioning to Lantus 5 every day, Novolog 0-9 units TID and 0-5 units at bedtime, Novolog 3 units TID with meals  New facial droop while in ED SLP evaluation pending  Inpatient Diabetes Program Recommendations:    Given drip rates, Please consider:  Lantus 20 units 2 hrs prior to discontinuing IV insulin, then every day Novolog 0-9 units Q4H if NPO  Novolog 0-9 units TID and 0-5 units at bedtime + Novolog 3 units TID with meals if she is eating at least 50%  Met with granddaughter briefly at bedside in ED.  April Sheppard lives with her and she administers her medications.  Sometimes she holds her Metformin because it is a large dose.   Will follow while inpatient.   Thank you, [name], MSN, CDCES Diabetes Coordinator Inpatient Diabetes Program [phone] (team pager from 8a-5p)

## 2024-05-09 NOTE — ED Notes (Signed)
 Ceclia SLP at bedside during bedside swallow screen. Screen stopped prior to giving patient PO fluids on her recommendation due to facial droop and weakness. Patients granddaughter Luke at bedside to interpret.

## 2024-05-09 NOTE — Progress Notes (Addendum)
 Progress Note   Patient: April Sheppard FMW:[id number] DOB: [birth year] DOA: 05/08/2024     0 DOS: [name] patient was seen and examined on 05/09/2024   Brief hospital course: 78yo with h/o CAD s/p CABG, HTN, HLD, DM, and hypothyroidism who presented on 10/9 with n/v.  She was hypothermic and placed on [employer]  +urinary retention, 1500 cc drained with I/O and now with another on bladder scan so foley placed.  Found to be in DKA, placed on Endotool protocol.  Concern for sepsis from UTI, started on Ceftraixone.  Head CT with concern for possible CVA, and MRI confirms a large PICA stroke.  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; +cytotoxic edema, no hemorrhage or mass effect Neurology consulted Needs ASA + Plavix but will give ASA 300 mg PR until able to take PO Admit to neuroprogressive unit Permissive HTN with BP goal 150-190 PT/OT consulted SLP consulted - too somnolent to safely take PO for now so remains NPO   DKA Patient with history of type 2 diabetes on metformin but noncompliant with home meds A1c 13.2, very poorly controlled Initial labs showing glucose 335, bicarb 16, anion gap 24, beta hydroxybutyric acid 5.66, UA with 80 ketones, VBG with pH 7.34 Started Endotool per DKA protocol Gap closed, acidosis resolved She remains unable to take PO (failed swallow) so given 5 units basal and then q4h SSI Diabetes coordinator recommends increasing to 25 units but will hold for now in [name] setting of NPO  ?UTI Patient with rectal temperature 97 F and placed on Bair hugger in [name] ED WBC count 12.4 No tachycardia, tachypnea, or hypotension UA with positive nitrite, small amount of leukocytes, and microscopy showing 0-5 WBCs and rare bacteria Per granddaughter, patient has not endorsed  any urinary symptoms Patient was given a dose of ceftriaxone in [name] ED Blood and urine cultures pending Hold additional antibiotics at this time   Hypertensive urgency In [name] setting of medication noncompliance Blood pressure 242/86 on arrival and patient was given IV hydralazine in [name] ED SBP goal currently 150-190 per neurology Continue prn IV hydralazine HR will not allow prn beta blocker at this time   Acute urinary retention I/O cath done in [name] ED and 1500 mL urine drained Repeat bladder scan with 1000 mL this AM Foley placed   Elevated LFTs Alk phos 257 and T. bili 1.5 on presentation CT showing hepatic steatosis but no gallstones or biliary dilatation LFTs have normalized   Abnormal CT finding CT showing aortic atherosclerosis including mitral annular, coronary artery, and aortic valve leaflet calcifications concerning for possible aortic stenosis Echocardiogram ordered   CAD status post CABG EKG showing T wave inversions in lateral leads which appear new compared to previous EKG from April 2025 However, ACS less likely as patient is not endorsing chest pain and troponin negative x 2 Echocardiogram ordered as above  Hyperlipidemia Start Lipitor when able to take PO  Hypothyroidism TSH is elevated Increase Synthroid dose to 100 mcg daily (from 75) Needs repeat TSH in 4-6 weeks If unable to take PO soon, will need to transition to IV Synthroid  RUL Nodular Opacities Recommended for repeat non-contrast CT at 3-6 months  GOC Briefly discussed code status with her granddaughter She would like to speak with her mother, [name] patient's daughter This will need to be an ongoing  discussion, particularly if her neurologic status does not markedly improve      Consultants: Neurology DM Coordinator SLP PT OT  Procedures: Echocardiogram 10/10  Antibiotics: Ceftriaxone x 1      Subjective: Awakens and can briefly chat with her granddaughter in [place] but  lapses back to sleep.  Physical Exam: Vitals:   05/09/24 0100 05/09/24 0200 05/09/24 0500 05/09/24 0845  BP: (!) 168/61 (!) 175/63 (!) 187/73 (!) 172/63  Pulse: (!) 56 (!) 53 60 (!) 57  Resp: 11 16 14 11   Temp:    97.6 F (36.4 C)  TempSrc:      SpO2: 99% 100% 100% 99%    Intake/Output Summary (Last 24 hours) at 05/09/2024 0915 Last data filed at 05/08/2024 2311 Gross per 24 hour  Intake 3056.08 ml  Output --  Net 3056.08 ml   There were no vitals filed for this visit.  Exam:  General:  Appears ill Eyes:  normal lids, iris ENT:  grossly normal hearing, lips & tongue, mmm Cardiovascular:  RR with mild bradycardia. No Divito edema.  Respiratory:   CTA bilaterally with no wheezes/rales/rhonchi.  Normal respiratory effort. Abdomen:  soft, NT, ND Skin:  no rash or induration seen on limited exam Musculoskeletal:  grossly normal tone BUE/BLE, no bony abnormality Psychiatric:  somnolent mood and affect, speech limited and in [place] but appropriate per granddaughter Neurologic:  limited by somnolence, generalized weakness without obvious focal deficit  Data Reviewed: I have reviewed Kahmari patient's lab results since admission.  Pertinent labs for today include:   K+ 3.4, up from 2.5 Glucose 167 CO2 22 TSH 8.070    Family Communication: Granddaughter was present  Disposition: Status is: Inpatient Admit - It is my clinical opinion that admission to INPATIENT is reasonable and necessary because of [name] expectation that this patient will require hospital care that crosses at least 2 midnights to treat this condition based on [name] medical complexity of [name] problems presented.  Given [name] aforementioned information, [name] predictability of an adverse outcome is felt to be significant.    Planned Discharge Destination: To be determined    Time spent: 50 minutes  Author: Delon Herald, MD 05/09/2024 9:12 AM  For on call review [email or web address].

## 2024-05-09 NOTE — Progress Notes (Signed)
 Clinical/Bedside Swallow Evaluation Patient Details  Name: April Sheppard MRN: [id number] Date of Birth: [birth year]  Today's Date: 05/09/2024 Time: SLP Start Time (ACUTE ONLY): 1120 SLP Stop Time (ACUTE ONLY): 1138 SLP Time Calculation (min) (ACUTE ONLY): 18 min  Past Medical History:  Past Medical History:  Diagnosis Date   Diabetes mellitus without complication (HCC)    Hypertension    Thyroid disease    Past Surgical History: History reviewed. No pertinent surgical history. HPI:  78yo Vietnamese speaking female admitted 05/08/24 with n/v, generalized weakness. PMH: CAD, STEMI (May 2022), CABG, HTN, HLD, DM2, hypothyroidism. CXR negative. New right facial droop noted. 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    Assessment / Plan / Recommendation  Clinical Impression  Limited BSE completed, due to altered mentation. Pt presents with marked right facial weakness. Difficulty with lingual lateralization and labial strength/ROM. Speech is dysarthric per family. Pt is edentulous - family has dentures. Pt is accustomed to eating with dentures in place. Family reports no history of swallowing difficulty prior to admit, with tolerance of regular solids and thin liquids. Volitional cough is weak.   No PO presentations were given at this time, given significant oral motor weakness, and that pt was unable to maintain alertness for safe trials. Recommend NPO status at this time, including medications. RN/MD informed. ST will continue to follow to assess readiness to begin PO intake.  SLP Visit Diagnosis: Dysphagia, unspecified (R13.10)    Aspiration Risk  Severe aspiration risk;Risk for inadequate nutrition/hydration    Diet Recommendation NPO    Medication Administration: Via alternative means    Other  Recommendations Oral Care Recommendations: Oral care  QID Caregiver Recommendations: Have oral suction available     Assistance Recommended at Discharge  TBD  Functional Status Assessment Patient has had a recent decline in their functional status and demonstrates Rhianon ability to make significant improvements in function in a reasonable and predictable amount of time.  Frequency and Duration min 1 x/week  2 weeks       Prognosis Prognosis for improved oropharyngeal function: Good      Swallow Study   General Date of Onset: 05/08/24 HPI: 78yo Vietnamese speaking female admitted 05/08/24 with n/v, generalized weakness. PMH: CAD, STEMI (May 2022), CABG, HTN, HLD, DM2, hypothyroidism. CXR negative. New right facial droop noted. 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 Underlying chronic ischemic disease in Latoshia cerebellum, left MCA, and bilateral PCA territories. Type of Study: Bedside Swallow Evaluation Previous Swallow Assessment: none Diet Prior to this Study: Regular;Thin liquids (Level 0) Temperature Spikes Noted: No Respiratory Status: Room air History of Recent Intubation: No Behavior/Cognition: Lethargic/Drowsy;Requires cueing;Doesn't follow directions Oral Cavity Assessment: Within Functional Limits Oral Care Completed by SLP: No Oral Cavity - Dentition: Edentulous Baseline Vocal Quality: Low vocal intensity Volitional Cough: Weak Volitional Swallow: Unable to elicit    Oral/Motor/Sensory Function Overall Oral Motor/Sensory Function: Moderate impairment Facial ROM: Reduced right Facial Symmetry: Abnormal symmetry right Facial Strength: Reduced right Lingual ROM: Reduced right;Reduced left Lingual Symmetry: Within Functional Limits Lingual Strength: Reduced Mandible: Within Functional Limits   Ice Chips Ice chips: Not tested   Thin Liquid Thin Liquid: Not tested    Nectar Thick Nectar Thick Liquid: Not tested   Honey Thick Honey Thick Liquid: Not tested   Puree Puree: Not  tested   Solid     Solid:  Not tested     Isami Mehra B. Dory, MSP, CCC-SLP Speech Language Pathologist Office: [phone]  Dory Caprice Daring 05/09/2024,11:48 AM

## 2024-05-09 NOTE — Plan of Care (Signed)
°  Problem: Clinical Measurements: Goal: Will remain free from infection Outcome: Progressing   Problem: Nutrition: Goal: Adequate nutrition will be maintained Outcome: Progressing   Problem: Coping: Goal: Level of anxiety will decrease Outcome: Progressing

## 2024-05-09 NOTE — ED Notes (Signed)
 Per provider Stefano Horns, get BMP and Beta at 0400

## 2024-05-09 NOTE — ED Notes (Deleted)
 Patient

## 2024-05-09 NOTE — ED Notes (Signed)
 Hand off complete, room is still dirty.

## 2024-05-09 NOTE — ED Notes (Signed)
 [facility] called to coordinate transport.

## 2024-05-09 NOTE — Consult Note (Addendum)
 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 BP on arrival was 242/86, WBC of 12.4, and BG of 335 with anion gap concerning for DKA and urosepsis.  NIHSS of 19 due to decreased level of consciousness, confusion, non fluent aphasia, generalized weakness with right lower facial droop.  CTH without hemorrhage, notable for right greater than left cerebellar hypodensities, and encephalomalacia of left frontal and right occipital lobes.  CTA with left vert occlusion at origin, right vert is dominant with significant atherosclerosis with an occlusion intracranially, basilar artery is small with possible mid basilar occlusion, and bilateral P2 occlusions.  Outside thrombolytic window.   Syndrome is most concerning for acute to subacute ischemic stroke of April Sheppard posterior circulation due to acute right vertebral, basilar, P2 occlusion.  Discussed case with NIR Dr. Rosslyn, recommending stat MRI brain w/o to assess for ischemia given multiple co morbidities contributing to altered mental status.  Recommend laying head of bed flat, permissive HTN, aspirin 325mg  PR, and admission to ICU  for q1 neurochecks and stroke workup.  Addendum: MRI brain w/o with diffusion restriction of cerebellum, right greater than left, and bilateral thalami. Flair changes noted in cerebellum but not thalami. Case discussed with Dr. Lester, not a thrombectomy candidate.  Our recommendations are outlined below.  Recommendations:        Stroke/Telemetry Floor       Neuro Checks (Q1)       Bedside Swallow Eval       DVT Prophylaxis       IV Fluids, Normal Saline       Euglycemia and Avoid Hyperthermia (PRN Acetaminophen)       Hold Anticoagulation for Now       Initiate or continue Aspirin 325 MG daily       Aspirin per rectum       Antihypertensives PRN if Blood pressure is greater than 220/120 or there is a concern for End organ damage/contraindications for permissive HTN. If blood pressure is greater than 220/120 give labetalol  PO or IV or Vasotec IV with a goal of 15% reduction in BP during April Sheppard first 24 hours.  Sign Out:       Hospitalist pages without response    ------------------------------------------------------------------------------  Advanced Imaging: CTA Head and Neck Completed.  LVO:Yes  Discussed with NIR :Yes  Initial Call Time To NIR : 05/09/2024 04:49:04  NIR Accept/Decision Time : 05/09/2024 05:08:32  Discussed with NIR Time : 05/09/2024 04:59:01  Neurointerventionalist Accepted Case : Patient is not a candidate for intervention at this time   Metrics:  Last Known Well: Unknown Dispatch Time: 05/09/2024 03:49:30 Arrival Time: 05/09/2024 04:34:15 Initial Response Time: 05/09/2024 03:52:20 Symptoms: right sided facial droop. Initial patient interaction: 05/09/2024 04:00:00 NIHSS Assessment Completed: 05/09/2024 04:26:40 Patient is not a candidate for Thrombolytic. Thrombolytic Medical Decision: 05/09/2024 04:26:41 Patient was not deemed candidate for Thrombolytic because of following reasons: LKW outside 4.5 hr window. .  CT Head: I personally reviewed  all [facility] CT images that were available to me and it showed: no hemorrhage, notable for right greater than left cerebellar hypodensities, and encephalomalacia of left frontal and right occipital lobes  ED Physician not notified of diagnostic impression and management plan because Dr. Alfornia paged, awaiting response    ------------------------------------------------------------------------------  History of Present Illness: Patient is a 78 year old Female.  Patient was brought by EMS for symptoms of right sided facial droop. 78 yo F, [place] speaking, with PMH of HTN, VTE, CAD s/p CABG, DM2, presented to ED with vomiting for 2 days and lethargy, with stroke alert called for right sided facial droop. Patient is a limited historian. History obtained by daughter at bedside who also interpreted. Patient has been feeling unwell for April Sheppard past two days with vomiting. She initially felt a little better yesterday but since this morning has been unable to get out of bed, which was attributed to lethargy. Daughter reports she is normally able to ambulate with a cane at baseline. Denies history of stroke. Daughter denies fever, trauma, blood thinner sue.     Past Medical History:      Hypertension      Diabetes Mellitus      Coronary Artery Disease  Medications:  No Anticoagulant use  No Antiplatelet use Reviewed EMR for current medications  Allergies:  Reviewed  Social History: Smoking: No Alcohol Use: No  Family History:  There is no family history of premature cerebrovascular disease pertinent to this consultation  ROS : 14 Points Review of Systems was performed and was negative except mentioned in HPI.  Past Surgical History: There Is No Surgical History Contributory To Today's Visit     Examination: BP(182/75), Pulse(55), Blood Glucose(178) 1A: Level of Consciousness - Requires repeated stimulation to arouse + 2 1B: Ask Month and Age - Could Not Answer Either Question  Correctly + 2 1C: Blink Eyes & Squeeze Hands - Performs 1 Task + 1 2: Test Horizontal Extraocular Movements - Normal + 0 3: Test Visual Fields - No Visual Loss + 0 4: Test Facial Palsy (Use Grimace if Obtunded) - Partial paralysis (lower face) + 2 5A: Test Left Arm Motor Drift - Drift, hits bed + 2 5B: Test Right Arm Motor Drift - Drift, hits bed + 2 6A: Test Left Leg Motor Drift - No Effort Against Gravity + 3 6B: Test Right Leg Motor Drift - Some Effort Against Gravity + 2 7: Test Limb Ataxia (FNF/Heel-Shin) - Does Not Understand + 0 8: Test Sensation - Normal; No sensory loss + 0 9: Test Language/Aphasia - Severe Aphasia: Fragmentary Expression, Inference Needed, Cannot Identify Materials + 2 10: Test Dysarthria - Mild-Moderate Dysarthria: Slurring but can be understood + 1 11: Test Extinction/Inattention - No abnormality + 0  NIHSS Score: 19   Pre-Morbid Modified Rankin Scale: 3 Points = Moderate disability; requiring some help, but able to walk without assistance  I reviewed April Sheppard available imaging via [facility] and initiated discussion with April Sheppard primary provider  This consult was conducted in real time using interactive audio and Immunologist. Patient was informed of  April Sheppard technology being used for this visit and agreed to proceed. Patient located in hospital and provider located at home/office setting.   Patient is being evaluated for possible acute neurologic impairment and high probability of imminent or life-threatening deterioration. I spent total of 51 minutes providing care to this patient, including time for face to face visit via telemedicine, review of medical records, imaging studies and discussion of findings with providers, April Sheppard patient and/or family.    Dr Shauna Skains   [facility] For Inpatient follow-up with [facility] physician please call [facility] at [phone]. As we are not an outpatient service for any post hospital discharge needs please contact  April Sheppard hospital for assistance. If you have any questions for April Sheppard [facility] physicians or need to reconsult for clinical or diagnostic changes please contact us  via [facility] at [phone].   Signature : April Sheppard

## 2024-05-09 NOTE — Progress Notes (Signed)
 CTA head and neck showing: IMPRESSION: 1. Left vertebral artery origin occlusion with nonopacification in [place] neck and intradurally. 2. 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 3. Bilateral P2 PCA occlusion. 4. Small and severely stenotic basilar artery. 5. Severely stenotic vs occluded mid right M2 MCA with distal reconstitution. 6. Severe left and moderate right intracranial ICA stenosis. 7. Severe left M2 MCA stenosis. 8. Severe left A2 ACA stenosis. 9. Right upper lobe nodular opacities, most likely infectious/inflammatory. Non-contrast chest CT at 3-6 months is recommended. If Tanishia nodules are stable at time of repeat CT, then future CT at 18-24 months (from today's scan) is considered optional for low-risk patients, but is recommended for high-risk patients. This recommendation follows [place] consensus statement: Guidelines for Management of Incidental Pulmonary Nodules Detected on CT Images: From Don[place] Fleischner Society 2017; Radiology 2017; [id number].  -Neurology ordered loading dose aspirin and Plavix.  Recommending dual antiplatelet therapy for 3 months followed by a single agent.

## 2024-05-09 NOTE — ED Provider Notes (Signed)
 [place] EMERGENCY DEPARTMENT AT [facility] Provider Note   CSN: [id number] Arrival date & time: 05/08/24  1434     Patient presents with: Hyperglycemia   Haruko Keltner is a 78 y.o. female with history of hypertension and diabetes, noncompliant to medications presents to [facility] emergency department today for evaluation of vomiting for Marlow past 2 days as well as generalized fatigue.  Granddaughter is at bedside and provides a majority of Caiya history. Translator service offered, but declined. She reports that they have tried using Artasia translator service in [place] past and Sandrika patient does not use a translator service well and will usually not speak with it.  Granddaughter reports that she started developing some vomiting 2 days ago.  Yesterday was feeling better however still did not want to eat or drink.  Today, she had more generalized weakness and was not went to eat or drink at all so brought her back into [facility] emergency department.  Reports that usually she has around 2 bowel movements a day but has not had a bowel movement in 3 days.  Briunna patient reports that she still is passing gas.  Denies any abdominal pain.  Denies any dysuria or hematuria.  Patient denies any chest pain or trouble swallowing.  Denies any fever or recent cough or cold symptoms..  Reports that she only takes her medications a few times a week or whenever, she stops feeling well.  Denies black or bloody stool.  Granddaughter is adamant that Latara patient is at her baseline mentation.  Reports that occasionally she has some intermittent confusion, especially when not at home.Celese patient reports that she has been experiencing some lightheadedness with change of position. She had a headache two days ago that she was given Tylenol for.     Hyperglycemia Associated symptoms: vomiting        Prior to Admission medications   Medication Sig Start Date End Date Taking? Authorizing Provider  amLODipine  (NORVASC ) 10 MG tablet Take  1 tablet (10 mg total) by mouth daily. 11/05/23 05/08/24  [name] T, DO  carvedilol  (COREG ) 12.5 MG tablet Take 1 tablet (12.5 mg total) by mouth 2 (two) times daily with a meal. 11/05/23 05/08/24  [name] T, DO  hydrALAZINE (APRESOLINE) 25 MG tablet Take 25 mg by mouth 2 (two) times daily.     [provider]  hydrochlorothiazide  (HYDRODIURIL ) 25 MG tablet Take 1 tablet (25 mg total) by mouth daily. 11/24/13   [name], [name], MD  levothyroxine (SYNTHROID, LEVOTHROID) 75 MCG tablet Take 75 mcg by mouth daily before breakfast.    [provider]  metFORMIN (GLUCOPHAGE) 500 MG tablet Take by mouth 2 (two) times daily with a meal.    [provider]  potassium chloride (KLOR-CON) 10 MEQ CR tablet Take 10 mEq by mouth daily.      [provider]  triamterene-hydrochlorothiazide  (DYAZIDE) 37.5-25 MG per capsule Take 1 capsule by mouth every morning.      [provider]    Allergies: Lisinopril    Review of Systems  Gastrointestinal:  Positive for vomiting.  See HPI  Updated Vital Signs BP (!) 158/57 (BP Location: Left Arm)   Pulse (!) 57   Temp (!) 96.9 F (36.1 C) (Axillary)   Resp 14   SpO2 100%   Physical Exam Vitals and nursing note reviewed.  Constitutional:      Appearance: She is not toxic-appearing.     Comments: Somnolent, but arouses to verbal  stimuli.  HENT:     Mouth/Throat:     Mouth: Mucous membranes are dry.     Comments: Very dry mucous membranes Eyes:     General: No scleral icterus.    Comments: Quick beating left nystagmus  Cardiovascular:     Rate and Rhythm: Normal rate.  Pulmonary:     Effort: Pulmonary effort is normal. No respiratory distress.  Abdominal:     Palpations: Abdomen is soft.     Tenderness: There is abdominal tenderness. There is no right CVA tenderness, left CVA tenderness, guarding or rebound.     Comments: Mild left lower quadrant tenderness to palpation.  Soft.  No guarding or  rebound.  Musculoskeletal:     Cervical back: Normal range of motion.     Right lower leg: No edema.     Left lower leg: No edema.  Skin:    General: Skin is warm and dry.  Neurological:     Mental Status: Mental status is at baseline.     Comments: Patient has difficulty with neurological examination with following directions, question assist from possible confusion versus senility versus language barrier. Cranial nerves grossly intact. She had difficulty with following instructions for external ocular movements and pronator drift.  Generalized weakness in upper and lower extremities however symmetric.  Reports sensations intact throughout.  At baseline per family.     (all labs ordered are listed, but only abnormal results are displayed) Labs Reviewed  CBC WITH DIFFERENTIAL/PLATELET - Abnormal; Notable for Blakeley following components:      Result Value   WBC 12.4 (*)    RBC 5.81 (*)    Hemoglobin 16.8 (*)    HCT 50.6 (*)    Neutro Abs 10.4 (*)    All other components within normal limits  COMPREHENSIVE METABOLIC PANEL WITH GFR - Abnormal; Notable for Teneil following components:   CO2 16 (*)    Glucose, Bld 335 (*)    BUN 34 (*)    Alkaline Phosphatase 257 (*)    Total Bilirubin 1.5 (*)    Anion gap 24 (*)    All other components within normal limits  URINALYSIS, ROUTINE W REFLEX MICROSCOPIC - Abnormal; Notable for Briteny following components:   Glucose, UA >=500 (*)    Ketones, ur 80 (*)    Protein, ur 100 (*)    Nitrite POSITIVE (*)    Leukocytes,Ua SMALL (*)    Bacteria, UA RARE (*)    All other components within normal limits  BETA-HYDROXYBUTYRIC ACID - Abnormal; Notable for Arshia following components:   Beta-Hydroxybutyric Acid 5.66 (*)    All other components within normal limits  BLOOD GAS, VENOUS - Abnormal; Notable for Amilah following components:   pCO2, Ven 38 (*)    Acid-base deficit 4.7 (*)    All other components within normal limits  BASIC METABOLIC PANEL WITH GFR -  Abnormal; Notable for Kamill following components:   Chloride 97 (*)    CO2 18 (*)    Glucose, Bld 326 (*)    BUN 32 (*)    Anion gap 24 (*)    All other components within normal limits  BASIC METABOLIC PANEL WITH GFR - Abnormal; Notable for Alizon following components:   Glucose, Bld 191 (*)    BUN 24 (*)    All other components within normal limits  BASIC METABOLIC PANEL WITH GFR - Abnormal; Notable for Kaira following components:   Potassium 3.0 (*)    Glucose, Bld 219 (*)  Calcium 8.5 (*)    All other components within normal limits  OSMOLALITY - Abnormal; Notable for Raksha following components:   Osmolality 330 (*)    All other components within normal limits  BETA-HYDROXYBUTYRIC ACID - Abnormal; Notable for Taleyah following components:   Beta-Hydroxybutyric Acid 1.07 (*)    All other components within normal limits  CBG MONITORING, ED - Abnormal; Notable for Nadine following components:   Glucose-Capillary 297 (*)    All other components within normal limits  CBG MONITORING, ED - Abnormal; Notable for Holland following components:   Glucose-Capillary 321 (*)    All other components within normal limits  I-STAT CG4 LACTIC ACID, ED - Abnormal; Notable for Lilyan following components:   Lactic Acid, Venous 2.1 (*)    All other components within normal limits  CBG MONITORING, ED - Abnormal; Notable for Deloyce following components:   Glucose-Capillary 240 (*)    All other components within normal limits  CBG MONITORING, ED - Abnormal; Notable for Hafsa following components:   Glucose-Capillary 176 (*)    All other components within normal limits  CBG MONITORING, ED - Abnormal; Notable for Iza following components:   Glucose-Capillary 178 (*)    All other components within normal limits  CBG MONITORING, ED - Abnormal; Notable for Denora following components:   Glucose-Capillary 185 (*)    All other components within normal limits  CBG MONITORING, ED - Abnormal; Notable for Mekhia following components:    Glucose-Capillary 159 (*)    All other components within normal limits  CBG MONITORING, ED - Abnormal; Notable for Monia following components:   Glucose-Capillary 149 (*)    All other components within normal limits  URINE CULTURE  CULTURE, BLOOD (ROUTINE X 2)  CULTURE, BLOOD (ROUTINE X 2)  LIPASE, BLOOD  BASIC METABOLIC PANEL WITH GFR  BASIC METABOLIC PANEL WITH GFR  HEMOGLOBIN A1C  CBC  HEPATIC FUNCTION PANEL  TSH  BETA-HYDROXYBUTYRIC ACID  BETA-HYDROXYBUTYRIC ACID  BETA-HYDROXYBUTYRIC ACID  BETA-HYDROXYBUTYRIC ACID  BETA-HYDROXYBUTYRIC ACID  BETA-HYDROXYBUTYRIC ACID  I-STAT CG4 LACTIC ACID, ED  TROPONIN T, HIGH SENSITIVITY  TROPONIN T, HIGH SENSITIVITY    EKG: EKG Interpretation Date/Time:  Thursday May 08 2024 15:14:13 EDT Ventricular Rate:  61 PR Interval:  181 QRS Duration:  107 QT Interval:  478 QTC Calculation: 482 R Axis:   -47  Text Interpretation: Sinus rhythm Probable left atrial enlargement LAD, consider left anterior fascicular block Abnormal R-wave progression, late transition LVH with secondary repolarization abnormality Repolarization abnormality new since prior EKG Confirmed by Franklyn Gills [phone]) on 05/08/2024 3:23:59 PM  Radiology: CT Head Wo Contrast Addendum Date: 05/08/2024 ADDENDUM REPORT: 05/08/2024 19:20 ADDENDUM: These results were called by telephone at Cheray time of interpretation on 05/08/2024 at 7:20 pm to provider Jemimah Cressy , who verbally acknowledged these results. Electronically Signed   By: Morgane  Naveau M.D.   On: 05/08/2024 19:20   Result Date: 05/08/2024 CLINICAL DATA:  Headache, new onset (Age >= 51y) EXAM: CT HEAD WITHOUT CONTRAST TECHNIQUE: Contiguous axial images were obtained from Tesa base of Lucella skull through Persais vertex without intravenous contrast. RADIATION DOSE REDUCTION: This exam was performed according to Tyanne departmental dose-optimization program which includes automated exposure control, adjustment of Jayla mA and/or kV  according to patient size and/or use of iterative reconstruction technique. COMPARISON:  CT head 10/03/2021 FINDINGS: Brain: Interval development of loss of gray-white matter differentiation with associated vasogenic edema of Robyne bilateral cerebellar hemispheres, right greater than left. Underlying chronic bilateral  cerebellar infarctions. Left frontal and right occipital encephalomalacia. Patchy and confluent areas of decreased attenuation are noted throughout Zaya deep and periventricular white matter of Terree cerebral hemispheres bilaterally, compatible with chronic microvascular ischemic disease. No parenchymal hemorrhage. No mass lesion. No extra-axial collection. No mass effect or midline shift. No hydrocephalus. Basilar cisterns are patent. Vascular: No hyperdense vessel. Atherosclerotic calcifications are present within Amahia cavernous internal carotid and vertebral arteries. Skull: No acute fracture or focal lesion. Sinuses/Orbits: Paranasal sinuses and mastoid air cells are clear. Jniyah orbits are unremarkable. Other: None. IMPRESSION: 1. 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 Recommend MRI without contrast for further evaluation. 2. No acute intracranial hemorrhage. Electronically Signed: By: Morgane  Naveau M.D. On: 05/08/2024 19:17   CT ABDOMEN PELVIS W CONTRAST Result Date: 05/08/2024 CLINICAL DATA:  LLQ abdominal pain EXAM: CT ABDOMEN AND PELVIS WITH CONTRAST TECHNIQUE: Multidetector CT imaging of Osha abdomen and pelvis was performed using Jaidan standard protocol following bolus administration of intravenous contrast. RADIATION DOSE REDUCTION: This exam was performed according to Kenley departmental dose-optimization program which includes automated exposure control, adjustment of Kashlynn mA and/or kV according to patient size and/or use of iterative reconstruction technique. CONTRAST:  100mL OMNIPAQUE IOHEXOL 300 MG/ML  SOLN COMPARISON:  None Available.  FINDINGS: Lower chest: Coronary artery calcification. Mitral annular calcification. Aortic valve leaflet calcification. Atherosclerotic plaque. Hepatobiliary: Aniston hepatic parenchyma is diffusely hypodense compared to Hannia splenic parenchyma consistent with fatty infiltration. No focal liver abnormality. No gallstones, gallbladder wall thickening, or pericholecystic fluid. No biliary dilatation. Pancreas: No focal lesion. Normal pancreatic contour. No surrounding inflammatory changes. No main pancreatic ductal dilatation. Spleen: Normal in size without focal abnormality.  Splenule noted Adrenals/Urinary Tract: No adrenal nodule bilaterally. Bilateral kidneys enhance symmetrically. No hydronephrosis. No hydroureter. Kai urinary bladder is distended with urine. On delayed imaging, there is no urothelial wall thickening and there are no filling defects in Ammara opacified portions of Cristianna bilateral collecting systems or ureters. Stomach/Bowel: Stomach is within normal limits. No evidence of bowel wall thickening or dilatation. Colonic diverticulosis. Appendix appears normal. Vascular/Lymphatic: No abdominal aorta or iliac aneurysm. Severe atherosclerotic plaque of Sherena aorta and its branches. No abdominal, pelvic, or inguinal lymphadenopathy. Reproductive: Uterus and bilateral adnexa are unremarkable. Other: No intraperitoneal free fluid. No intraperitoneal free gas. No organized fluid collection. Musculoskeletal: No abdominal wall hernia or abnormality. Diffusely decreased bone density. No suspicious lytic or blastic osseous lesions. No acute displaced fracture. Multilevel degenerative changes of Tanice spine. IMPRESSION: 1. No acute intra-abdominal or intrapelvic abnormality. 2. Hepatic steatosis. 3. Colonic diverticulosis. 4. Aortic Atherosclerosis (ICD10-I70.0) including mitral annular, coronary artery, aortic valve leaflet calcifications-correlate for aortic stenosis. Electronically Signed   By: Morgane  Naveau M.D.   On:  05/08/2024 19:12   DG Chest 2 View Result Date: 05/08/2024 CLINICAL DATA:  Hypertension. Hyperglycemia. Unwell feeling over Guliana past few days. Dizziness and weakness. Vomiting. EXAM: CHEST - 2 VIEW COMPARISON:  11/05/2023 FINDINGS: Postoperative changes in Madoline mediastinum. Heart size and pulmonary vascularity are normal for technique. Lungs are clear. No pleural effusion or pneumothorax. Mediastinal contours appear intact. Calcification of Cindi aorta. Degenerative changes in Sheryn spine. IMPRESSION: No active cardiopulmonary disease. Electronically Signed   By: Elsie Gravely M.D.   On: 05/08/2024 16:51    .Critical Care  Performed by: Bernis Ernst, PA-C Authorized by: Bernis Ernst, PA-C   Critical care provider statement:    Critical care time (minutes):  75   Critical care was necessary to  treat or prevent imminent or life-threatening deterioration of Tamasha following conditions:  Endocrine crisis, CNS failure or compromise and dehydration   Critical care was time spent personally by me on Anajulia following activities:  Development of treatment plan with patient or surrogate, discussions with consultants, evaluation of patient's response to treatment, examination of patient, ordering and review of laboratory studies, ordering and review of radiographic studies, ordering and performing treatments and interventions, pulse oximetry, re-evaluation of patient's condition, review of old charts and obtaining history from patient or surrogate   Care discussed with: admitting provider      Medications Ordered in [facility] ED  insulin regular, human (MYXREDLIN) 100 units/ 100 mL infusion (3.8 Units/hr Intravenous Rate/Dose Change 05/09/24 0149)  lactated ringers infusion (0 mLs Intravenous Stopped 05/08/24 2132)  dextrose 5 % in lactated ringers infusion ( Intravenous New Bag/Given 05/08/24 1938)  dextrose 50 % solution 0-50 mL (has no administration in time range)  cefTRIAXone (ROCEPHIN) 1 g in sodium chloride 0.9  % 100 mL IVPB (has no administration in time range)  enoxaparin (LOVENOX) injection 40 mg (has no administration in time range)  acetaminophen (TYLENOL) tablet 650 mg (has no administration in time range)    Or  acetaminophen (TYLENOL) suppository 650 mg (has no administration in time range)  aspirin tablet 325 mg (has no administration in time range)  clopidogrel (PLAVIX) tablet 300 mg (has no administration in time range)  potassium chloride 10 mEq in 100 mL IVPB (has no administration in time range)  clopidogrel (PLAVIX) tablet 75 mg (has no administration in time range)  aspirin EC tablet 81 mg (has no administration in time range)  lactated ringers bolus 1,000 mL (0 mLs Intravenous Stopped 05/08/24 1921)  potassium chloride 10 mEq in 100 mL IVPB (0 mEq Intravenous Stopped 05/08/24 2132)  hydrALAZINE (APRESOLINE) injection 10 mg (10 mg Intravenous Given 05/08/24 1716)  ondansetron (ZOFRAN) injection 4 mg (4 mg Intravenous Given 05/08/24 1712)  lactated ringers bolus 750 mL (0 mLs Intravenous Stopped 05/08/24 1922)  iohexol (OMNIPAQUE) 300 MG/ML solution 100 mL (100 mLs Intravenous Contrast Given 05/08/24 1848)  cefTRIAXone (ROCEPHIN) 1 g in sodium chloride 0.9 % 100 mL IVPB (0 g Intravenous Stopped 05/08/24 2311)  iohexol (OMNIPAQUE) 350 MG/ML injection 75 mL (75 mLs Intravenous Contrast Given 05/08/24 2343)    Clinical Course as of 05/09/24 0221  Thu May 08, 2024  1919 MRI non con brain [RR]  Fri May 09, 2024  0108 Critical read obtained from CTA head and neck from radiology.  Information given to hospitalist, Rathore [RR]    Clinical Course User Index [RR] Bernis Ernst, PA-C   Medical Decision Making Amount and/or Complexity of Data Reviewed Labs: ordered. Radiology: ordered. Decision-making details documented in ED Course.  Risk Prescription drug management. Decision regarding hospitalization.   78 y.o. female presents to [facility] ER for evaluation of vomiting, generalized weakness.  Differential diagnosis includes but is not limited to ACS/MI, Boerhaave's, DKA, elevated ICP, Ischemic bowel, Sepsis, drug-related, Appendicitis, Bowel obstruction, Electrolyte abnormalities, Pancreatitis, Biliary colic, Gastroenteritis, Gastroparesis, Hepatitis, Migraine, Thyroid disease, Renal colic, PUD, UTI. Vital signs significantly elevated BP 242/86 otherwise unremarkable. Patient is non compliant to meds. Physical exam as noted above.   On previous chart evaluation, she was seen in April 2025 for HTN urgency with chest pain. Has a h/o NSTEMI. Has documented non compliancy to medications.   Patient has difficulty following directions for examination.  Again, family is adamant that she is at her baseline but reports  that she is conversational at home.  Unsure if this is more from language barrier versus senility versus altered mental status.  I do smell ketones in Breely room, concern for DKA.  I have ordered Raushanah patient some fluids.  Will order some IV hydralazine for elevated blood pressure as well.  Lab work ordered.  Patient complaining of any abdominal pain.  Appears that she does have some mild left lower quadrant tenderness palpation.  Ordered a CT of her abdomen pelvis.  I am concerned due to her difficulty following direction, however patient family reports this is at her baseline, I have ordered a CT of her head.  I independently reviewed and interpreted Anaily patient's labs.  CBC shows mildly elevated white count of 12.4 with left shift 10.4 however her hemoglobin and hematocrit are also elevated as well.  Likely hemoconcentration in Ival setting of dehydration.  CMP shows a bicarb of 16, glucose of 335, BUN of 34 with an alk phos of 257.  Total bili at 1.5.  Anion gap of 24.  No other electrolyte or LFT abnormality.  Urinalysis shows greater than 500 glucose present with 80 ketones, 100 protein, positive nitrates, small leukocytes, with 0-5 red blood cells and white blood cells and rare bacteria.   I have cultured Rosann urine.  VBG shows a pH of 7.34.  Troponin of 19.  Osmolality of 330.  Beta hydroxy butyric acid at 5.66.  With lab results, patient likely picture with DKA/dehydration.  She started on hyperglycemia/DKA order set with fluids and IV insulin.  CT head  1. 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 Recommend MRI without contrast for further evaluation. 2. No acute intracranial hemorrhage. Per radiologist's interpretation.    CT abdomen pelvis : 1. No acute intra-abdominal or intrapelvic abnormality. 2. Hepatic steatosis. 3. Colonic diverticulosis. 4. Aortic Atherosclerosis (ICD10-I70.0) including mitral annular, coronary artery, aortic valve leaflet calcifications-correlate for aortic stenosis.  Per radiologist's interpretation.    Again, I have consulted with Rabecka family again, and they report that she is at her baseline mentation, but just more tired. Given Zaylah CT Head imaging, I consulted neurology and spoke with Dr. Aron Graces. He agrees with MRI non con of Zunairah brain for transfer over to [facility] for admission as this hospital currently does not have MRI available. Unsure of last known well since family reports that she is at her baseline mentation. Requests a CTA of Kamalei head and neck be ordered as well.  Will keep permissible hypertension and avoid any oral antihypertensives for now.  Patient will need to be admitted for DKA, dehydration, and CT head findings. Dr. Alfornia to admit.   ADDENDUM: I received page after hours for critical read for Yatzari patient's CTA. I have confirmed with Dr. Alfornia verbally that she received Nasiah results. I have included Haile neurologist and Argelia hospitalist into a chat to discuss if additional information/scans are needed.   I discussed this case with my attending physician who cosigned this note including patient's presenting symptoms, physical exam, and planned diagnostics and interventions.  Attending physician stated agreement with plan or made changes to plan which were implemented.   Portions of this report may have been transcribed using voice recognition software. Every effort was made to ensure accuracy; however, inadvertent computerized transcription errors may be present.    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

## 2024-05-09 NOTE — Progress Notes (Addendum)
     Patient Name: April Sheppard           DOB: 02/23/1946  MRN: [id number]      Admission Date: 05/08/2024  Attending Provider: Alfornia Madison, MD  Primary Diagnosis: DKA (diabetic ketoacidosis) (HCC)   Level of care: Progressive   OVERNIGHT EVENT   Notified by bedside RN of new right facial droop.  Prior head CT showing questionable subacute bilateral, right greater than left, cerebral infarctions. No acute intracranial hemorrhage   During my assessment, pt has visible flattening of right nasolabial fold as well as right eye droop. Per family at bedside, facial droop is new. No slurred speech noted by family.  Hard to assess full NIHSS given pt is drowsy and does not follow commands well.   Plan: Repeat STAT head CT per neuro recommendations  Allow for permissive hypertension. Treat for SBP above 210 or DBP about 100.    April Hartje, DNP, ACNPC- AG [facility]

## 2024-05-09 NOTE — ED Notes (Signed)
 NIH Stroke scale completed at bedside with assistance from patient's granddaughter as [place] interpreter. NIH score affected by patient's baseline confusion, previous deficits, and language.

## 2024-05-09 NOTE — Consult Note (Signed)
 NEUROLOGY CONSULT NOTE   Date of service: May 09, 2024 Patient Name: Pearley Millington MRN:  [id number] DOB:  May 16, 1946 Chief Complaint: vomiting, hyperglycemia, several days of dizziness Requesting Provider: Alfornia Madison, MD  History provided primarily by granddaughter Luke at bedside.  Interpreter was offered but granddaughter reported she and patient would prefer granddaughter to interpret as needed  History of Present Illness  Rainbow Burtis is a 78 y.o. 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  She also complained of headache at that time which was managed with Tylenol.  No other symptoms.  She seemed to be better on Wednesday, ambulated, use Semaj bathroom etc., ate a little bit but not as much as normal.  However since Thursday she was noted to be very sleepy and brought into [facility] for further evaluation.  Found to be in DKA with some concern for urinary retention as well as multifocal strokes predominantly involving Janet right cerebellum but also some in Santiana left cerebellum and small bilateral thalamic left greater than right strokes  Granddaughter notes that she has noted some right sided facial droop today that seems to have been fluctuating, was not noticed at home  Additionally last known well is somewhat unclear as it is not unusual for Paisli patient to be awake from 3 AM to 8 AM and then sleeping from 8 am to noon.  Granddaughter does feel that Kadian patient seemed to be her normal self at 9 PM on Monday although she was initially sleeping, she heard her wake up and care for herself etc. that evening  Granddaughter confirms patient is not typically adherent to her medications.  Since her CABG in May she uses a walker when ambulating outside of [facility] home but otherwise is independent in her ADLs,  IADLs, still managing her finances, bathing etc. although she does get help from [place] family for transportation when she needs to go somewhere.  She does not keep up with [place] date/month  LKW: Monday 10/6 Modified rankin score: 2-Slight disability-UNABLE to perform all activities but does not need assistance --  IV Thrombolysis: No, out of Janelle window EVT: No, out of Beyla window    NIHSS components Score: Comment  1a Level of Conscious 0[]  1[x]  2[]  3[]      1b LOC Questions 0[]  1[x]  2[]       1c LOC Commands 0[x]  1[]  2[]       2 Best Gaze 0[x]  1[]  2[]       3 Visual 0[]  1[x]  2[]  3[]      4 Facial Palsy 0[]  1[x]  2[]  3[]    Right sided facial droop  5a Motor Arm - left 0[x]  1[]  2[]  3[]  4[]  UN[]    5b Motor Arm - Right 0[]  1[x]  2[]  3[]  4[]  UN[]    6a Motor Leg - Left 0[]  1[]  2[x]  3[]  4[]  UN[]    6b Motor Leg - Right 0[]  1[]  2[x]  3[]  4[]  UN[]    7 Limb Ataxia 0[x]  1[]  2[]  UN[]      8 Sensory 0[x]  1[]  2[]  UN[]      9 Best Language 0[x]  1[]  2[]  3[]    Within limits of English as a foreign language and mental status  10 Dysarthria 0[]  1[x]  2[]  UN[]      11 Extinct. and Inattention 0[x]  1[]  2[]       TOTAL:       ROS  Limited due to patient somnolence, obtained from family as documented above  Past History   Past Medical History:  Diagnosis Date   Diabetes mellitus without complication (HCC)    Hypertension    Thyroid disease     History reviewed. No pertinent surgical history.  Family History: History reviewed. No pertinent family history.  Social History  reports that she has never smoked. She does not have any smokeless tobacco history on file. She reports that she does not drink alcohol and does not use drugs.  Allergies  Allergen Reactions   Lisinopril Swelling and Rash    Medications   Current Facility-Administered Medications:    acetaminophen (TYLENOL) tablet 650 mg, 650 mg, Oral, Q6H PRN **OR** acetaminophen (TYLENOL) suppository 650 mg, 650 mg, Rectal, Q6H PRN, Rathore,  Vasundhra, MD   amLODipine  (NORVASC ) tablet 10 mg, 10 mg, Oral, Daily, Barbarann Nest, MD   NOREEN ON 05/10/2024] aspirin EC tablet 81 mg, 81 mg, Oral, Daily, Rathore, Vasundhra, MD   carvedilol  (COREG ) tablet 12.5 mg, 12.5 mg, Oral, BID WC, Barbarann Nest, MD   cefTRIAXone (ROCEPHIN) 1 g in sodium chloride 0.9 % 100 mL IVPB, 1 g, Intravenous, Q24H, Rathore, Vasundhra, MD   [START ON 05/10/2024] clopidogrel (PLAVIX) tablet 75 mg, 75 mg, Oral, Daily, Rathore, Vasundhra, MD   dextrose 5 % in lactated ringers infusion, , Intravenous, Continuous, Amnah, Breuer, PA-C, Last Rate: 125 mL/hr at 05/08/24 1938, New Bag at 05/08/24 1938   dextrose 50 % solution 0-50 mL, 0-50 mL, Intravenous, PRN, Bernis Ernst, PA-C   enoxaparin (LOVENOX) injection 40 mg, 40 mg, Subcutaneous, Q24H, Ellington, Abby K, RPH   hydrALAZINE (APRESOLINE) tablet 25 mg, 25 mg, Oral, BID, Barbarann Nest, MD   insulin aspart (novoLOG) injection 0-5 Units, 0-5 Units, Subcutaneous, QHS, Barbarann Nest, MD   insulin aspart (novoLOG) injection 0-9 Units, 0-9 Units, Subcutaneous, TID WC, Barbarann Nest, MD   insulin aspart (novoLOG) injection 3 Units, 3 Units, Subcutaneous, TID WC, Barbarann Nest, MD   insulin glargine (LANTUS) injection 5 Units, 5 Units, Subcutaneous, Q24H, Rathore, Vasundhra, MD   insulin regular, human (MYXREDLIN) 100 units/ 100 mL infusion, , Intravenous, Continuous, Cami, Delawder, PA-C, Last Rate: 1.5 mL/hr at 05/09/24 0958, 1.5 Units/hr at 05/09/24 9041   lactated ringers infusion, , Intravenous, Continuous, Chesley, Valls, PA-C, Stopped at 05/08/24 2132   levothyroxine (SYNTHROID) tablet 100 mcg, 100 mcg, Oral, Q0600, Barbarann Nest, MD   potassium chloride (KLOR-CON) CR tablet 40 mEq, 40 mEq, Oral, Once, Barbarann Nest, MD  Current Outpatient Medications:    amLODipine  (NORVASC ) 10 MG tablet, Take 1 tablet (10 mg total) by mouth daily., Disp: 30 tablet, Rfl: 0   carvedilol  (COREG ) 12.5 MG tablet, Take 1  tablet (12.5 mg total) by mouth 2 (two) times daily with a meal., Disp: 60 tablet, Rfl: 0   hydrALAZINE (APRESOLINE) 25 MG tablet, Take 25 mg by mouth 2 (two) times daily. , Disp: , Rfl:    hydrochlorothiazide  (HYDRODIURIL ) 25 MG tablet, Take 1 tablet (25 mg total) by mouth daily., Disp: 30 tablet, Rfl: 1   levothyroxine (SYNTHROID, LEVOTHROID) 75 MCG tablet, Take 75 mcg by mouth daily before breakfast., Disp: , Rfl:    metFORMIN (GLUCOPHAGE) 500 MG tablet, Take by mouth 2 (two) times daily with a meal., Disp: , Rfl:    potassium chloride (KLOR-CON) 10 MEQ CR tablet, Take 10 mEq by mouth daily.  , Disp: , Rfl:    triamterene-hydrochlorothiazide  (DYAZIDE) 37.5-25 MG per capsule, Take 1 capsule by mouth  every morning.  , Disp: , Rfl:   Vitals   Vitals:   05/09/24 0100 05/09/24 0200 05/09/24 0500 05/09/24 0845  BP: (!) 168/61 (!) 175/63 (!) 187/73 (!) 172/63  Pulse: (!) 56 (!) 53 60 (!) 57  Resp: 11 16 14 11   Temp:    97.6 F (36.4 C)  TempSrc:    Oral  SpO2: 99% 100% 100% 99%    There is no height or weight on file to calculate BMI.  Current vital signs: BP (!) 172/63 (BP Location: Left Arm)   Pulse (!) 57   Temp 97.6 F (36.4 C) (Oral)   Resp 11   SpO2 99%  Vital signs in last 24 hours: Temp:  [96.9 F (36.1 C)-97.7 F (36.5 C)] 97.6 F (36.4 C) (10/10 0845) Pulse Rate:  [53-73] 57 (10/10 0845) Resp:  [11-17] 11 (10/10 0845) BP: (150-242)/(57-93) 172/63 (10/10 0845) SpO2:  [97 %-100 %] 99 % (10/10 0845)   Physical Exam   Constitutional: Appears fatigued Psych: Affect cooperative, flat Eyes: No scleral injection HENT: No oropharyngeal obstruction.  MSK: no major joint deformities.  Cardiovascular: Appears to be in sinus bradycardia on Lyric monitor Respiratory: Effort normal, non-labored breathing GI: Soft.  No distension. There is no tenderness.  Skin: Warm dry and intact visible skin  Neurologic Examination   Mental Status: Very sleepy.  Answers age correctly but  not month (which is baseline).  Follows all simple commands, within limits of requiring repetition due to frequently falling asleep during examination.  Does not repeat to command which appears to be secondary to somnolence but family reports patient is not using words correctly and has been understanding all of their communication while Cranial Nerves: II: Slightly reduced blink to threat on Taeko right compared to Shamica left. Pupils are equal, round, and reactive to light 4 mm to 2 mm brisk III,IV, VI: Tracks bilaterally.  Does have significant nystagmus to but denies dizziness.  When sleepy or has some downward ocular bobbing V: Facial sensation is symmetric to temperature/light touch VII: Facial movement is notable for fluctuating right facial droop, nearly resolves with activation.  VIII: hearing is intact to voice X: Uvula difficult to evaluate due to sleepiness XII: tongue is midline without atrophy or fasciculations.  Motor: Poor effort due to somnolence.  With a great deal of encouragement no drift of Orpha left upper extremity.  Slight drift of Jamesha right upper extremity.  Both lower extremities quickly fall to Brandalyn bed Sensory: Equally reactive to touch in all 4 extremities, reports no sensory loss within limits of sleepiness Deep Tendon Reflexes: Diminished throughout (brachioradialis and patellar) Cerebellar: Finger-nose intact within limits of weakness.  Too weak/sleepy to test heel-to-shin reliably Gait:  Deferred   Labs/Imaging/Neurodiagnostic studies    Basic Metabolic Panel: Recent Labs  Lab 05/08/24 1716 05/08/24 2300 05/09/24 0050 05/09/24 0537 05/09/24 0815  NA 138 137 138 141 135  K 3.8 3.6 3.0* 2.5* 3.4*  CL 97* 102 105 113* 100  CO2 18* 23 22 18* 22  GLUCOSE 326* 191* 219* 101* 167*  BUN 32* 24* 20 14 18   CREATININE 0.84 0.54 0.54 0.37* 0.53  CALCIUM 9.8 9.4 8.5* 6.7* 9.0    CBC: Recent Labs  Lab 05/08/24 1458 05/09/24 0537  WBC 12.4* 16.4*  NEUTROABS  10.4*  --   HGB 16.8* 14.0  HCT 50.6* 40.1  MCV 87.1 85.1  PLT 275 235    Lipid Panel:  No results found for: CHOL, HDL, LDLCALC,  LDLDIRECT, TRIG, CHOLHDL   HgbA1c:  Lab Results  Component Value Date   HGBA1C 13.2 (H) 05/08/2024   INR  Lab Results  Component Value Date   INR 0.89 06/09/2010   APTT  Lab Results  Component Value Date   APTT (H) 06/09/2010    38        IF BASELINE aPTT IS ELEVATED, SUGGEST PATIENT RISK ASSESSMENT BE USED TO DETERMINE APPROPRIATE ANTICOAGULANT THERAPY.   CT Head without contrast 10/10 4:37 AM (Personally reviewed and compared to 10/9 18:47):  1. Unchanged noncontrast CT appearance of Bristyn brain since yesterday. 2. Patchy and confluent right greater than left inferior cerebellar infarcts, no hemorrhagic transformation or mass effect 3. Underlying advanced chronic ischemic disease.  CT angio Head and Neck with contrast(Personally reviewed): 1. Left vertebral artery origin occlusion with nonopacification in Hatsue neck and intradurally. 2. Severe right V2 vertebral artery stenosis with occlusion of Nolah right intradural vertebral artery. 3. Bilateral P2 PCA occlusion. 4. Small and severely stenotic basilar artery. 5. Severely stenotic vs occluded mid right M2 MCA with distal reconstitution. 6. Severe left and moderate right intracranial ICA stenosis. 7. Severe left M2 MCA stenosis. 8. Severe left A2 ACA stenosis. 9. Right upper lobe nodular opacities, most likely infectious/inflammatory. Non-contrast chest CT at 3-6 months is recommended. If Huldah nodules are stable at time of repeat CT, then future CT at 18-24 months (from today's scan) is considered optional for low-risk patients, but is recommended for high-risk patients. This recommendation follows Raelan consensus statement: Guidelines for Management of Incidental Pulmonary Nodules Detected on CT Images: From [employer] Fleischner Society 2017; Radiology 2017; [id number].   MRI  Brain(personally reviewed, agree with radiology): 1. Extensive acute posterior circulation infarcts in Sha[place]ana setting of vertebral artery occlusion: right >left PICA territory involvement, right lateral medullary, and bifalamic involvement. 2. Cytotoxic edema without hemorrhagic transformation or mass effect. 3. Underlying chronic ischemic disease in Kolette cerebellum, left MCA, and bilateral PCA territories.   ASSESSMENT   Roshni Farina is a 78 y.o. female with vascular risk factors including hypertension, hyperlipidemia, diabetes, prior CABG, medication nonadherence, presenting with severe intracranial atherosclerotic disease which likely explains her posterior circulation strokes.  Discussed with daughter she is entering Paityn peak edema period at this time assuming her initial stroke happened sometime between Monday and Tuesday at 2 PM, determined by history provided given she had vomiting that was first noted around that time but last known well is fairly unclear  At this time, somnolence prevents safe oral access so we will need to use aspirin monotherapy PR until safe oral access can be established  While she is formally out of [place] permissive hypertension window due to Heath severity of her intracranial atherosclerotic disease, I would favor continued blood pressure goal of systolic blood pressure  150 - 190 today, very gradually reduce blood pressure  RECOMMENDATIONS  # Extensive posterior circulation stroke - Uncontrolled diabetes with A1c 13.2%, gradually adjust medications to achieve goal of less than 7% - Lipid panel pending, goal LDL less than 70 - MRI brain  - Echocardiogram pending - Prophylactic therapy-Antiplatelet med: Aspirin - dose 325mg  PO or 300mg  PR daily until oral access can be established - Plavix 75 mg daily for 90 day course once oral access is established - Risk factor modification - Telemetry monitoring; 30 day event monitor on discharge if no arrythmias captured  - Blood  pressure goal 150 - 190 today, very gradually reduce blood pressure, lay flat and fluid bolus if  she becomes symptomatic with Tessa lower blood pressure - PT consult, OT consult, Speech consult, unless patient is back to baseline - Stroke team to follow, due to risk of worsening do think that transfer to [facility] should remain in place and she should be monitored through Sunday for potential worsening  # Goals of care - Discussed risk of decompensation in [place] 3 to 5-day period after substantial PICA stroke.  Continue every 4 neurological checks.  Stat head CT for any decompensation including worsening headache, cranial nerve changes including pupillary changes - Family will discuss goals of care; we discussed unlikely to be a surgical candidate due to her age but could potentially attempt to temporize with salty solutions if she worsens although this would portend even worse functional status  ______________________________________________________________________    Lola Jernigan MD-PhD [facility] [phone]

## 2024-05-09 NOTE — ED Notes (Signed)
 [facility] at bedside. Paperwork given for transport.

## 2024-05-09 NOTE — ED Notes (Addendum)
 Yates, Md notfied of patient's systolic BP 210. MD states to only give additional dose of IV hydralazine 5 mg if patient's systolic BP is 220.

## 2024-05-09 NOTE — ED Notes (Addendum)
 PO Meds held per Delon Herald, MD due to patient intermittent lethergy. And having no PO intake after stroke evaluation. Per MD will also remain on Endotool until speech evaluates her.

## 2024-05-09 NOTE — Progress Notes (Signed)
  Echocardiogram 2D Echocardiogram has been performed.  April Sheppard 05/09/2024, 3:33 PM

## 2024-05-10 ENCOUNTER — Inpatient Hospital Stay (HOSPITAL_COMMUNITY)

## 2024-05-10 DIAGNOSIS — E1151 Type 2 diabetes mellitus with diabetic peripheral angiopathy without gangrene: Secondary | ICD-10-CM | POA: Diagnosis not present

## 2024-05-10 DIAGNOSIS — I63543 Cerebral infarction due to unspecified occlusion or stenosis of bilateral cerebellar arteries: Secondary | ICD-10-CM | POA: Diagnosis not present

## 2024-05-10 DIAGNOSIS — E785 Hyperlipidemia, unspecified: Secondary | ICD-10-CM

## 2024-05-10 DIAGNOSIS — R112 Nausea with vomiting, unspecified: Secondary | ICD-10-CM | POA: Diagnosis not present

## 2024-05-10 DIAGNOSIS — I6502 Occlusion and stenosis of left vertebral artery: Secondary | ICD-10-CM | POA: Diagnosis not present

## 2024-05-10 DIAGNOSIS — I639 Cerebral infarction, unspecified: Secondary | ICD-10-CM

## 2024-05-10 DIAGNOSIS — E1110 Type 2 diabetes mellitus with ketoacidosis without coma: Secondary | ICD-10-CM | POA: Diagnosis not present

## 2024-05-10 DIAGNOSIS — I6389 Other cerebral infarction: Secondary | ICD-10-CM | POA: Diagnosis not present

## 2024-05-10 DIAGNOSIS — N3000 Acute cystitis without hematuria: Secondary | ICD-10-CM | POA: Diagnosis not present

## 2024-05-10 DIAGNOSIS — Z794 Long term (current) use of insulin: Secondary | ICD-10-CM

## 2024-05-10 LAB — GLUCOSE, CAPILLARY
Glucose-Capillary: 125 mg/dL — ABNORMAL HIGH (ref 70–99)
Glucose-Capillary: 128 mg/dL — ABNORMAL HIGH (ref 70–99)
Glucose-Capillary: 182 mg/dL — ABNORMAL HIGH (ref 70–99)
Glucose-Capillary: 239 mg/dL — ABNORMAL HIGH (ref 70–99)
Glucose-Capillary: 302 mg/dL — ABNORMAL HIGH (ref 70–99)
Glucose-Capillary: 267 mg/dL — ABNORMAL HIGH (ref 70–99)

## 2024-05-10 LAB — CBC
HCT: 45.9 % (ref 36.0–46.0)
Hemoglobin: 16.2 g/dL — ABNORMAL HIGH (ref 12.0–15.0)
MCH: 29.7 pg (ref 26.0–34.0)
MCHC: 35.3 g/dL (ref 30.0–36.0)
MCV: 84.2 fL (ref 80.0–100.0)
Platelets: 242 K/uL (ref 150–400)
RBC: 5.45 MIL/uL — ABNORMAL HIGH (ref 3.87–5.11)
RDW: 12.4 % (ref 11.5–15.5)
WBC: 16.3 K/uL — ABNORMAL HIGH (ref 4.0–10.5)
nRBC: 0 % (ref 0.0–0.2)

## 2024-05-10 LAB — COMPREHENSIVE METABOLIC PANEL WITH GFR
ALT: 16 U/L (ref 0–44)
AST: 27 U/L (ref 15–41)
Albumin: 3.1 g/dL — ABNORMAL LOW (ref 3.5–5.0)
Alkaline Phosphatase: 165 U/L — ABNORMAL HIGH (ref 38–126)
Anion gap: 14 (ref 5–15)
BUN: 13 mg/dL (ref 8–23)
CO2: 20 mmol/L — ABNORMAL LOW (ref 22–32)
Calcium: 8.6 mg/dL — ABNORMAL LOW (ref 8.9–10.3)
Chloride: 98 mmol/L (ref 98–111)
Creatinine, Ser: 0.46 mg/dL (ref 0.44–1.00)
GFR, Estimated: >60 mL/min (ref >60)
Glucose, Bld: 120 mg/dL — ABNORMAL HIGH (ref 70–99)
Potassium: 3.1 mmol/L — ABNORMAL LOW (ref 3.5–5.1)
Sodium: 132 mmol/L — ABNORMAL LOW (ref 135–145)
Total Bilirubin: 1.5 mg/dL — ABNORMAL HIGH (ref 0.0–1.2)
Total Protein: 5.9 g/dL — ABNORMAL LOW (ref 6.5–8.1)

## 2024-05-10 LAB — LIPID PANEL
Cholesterol: 266 mg/dL — ABNORMAL HIGH (ref 0–200)
HDL: 61 mg/dL (ref >40)
LDL Cholesterol: 189 mg/dL — ABNORMAL HIGH (ref 0–99)
Total CHOL/HDL Ratio: 4.4 ratio
Triglycerides: 78 mg/dL (ref <150)
VLDL: 16 mg/dL (ref 0–40)

## 2024-05-10 MED ORDER — ASPIRIN 81 MG PO TBEC
81.0000 mg | DELAYED_RELEASE_TABLET | Freq: Every day | ORAL | Status: DC
  Administered 2024-05-11 – 2024-05-13 (×3): 81 mg via ORAL
  Filled 2024-05-10 (×3): qty 1

## 2024-05-10 MED ORDER — CHLORHEXIDINE GLUCONATE CLOTH 2 % EX PADS
6.0000 | MEDICATED_PAD | Freq: Every day | CUTANEOUS | Status: DC
  Administered 2024-05-10 – 2024-05-13 (×4): 6 via TOPICAL

## 2024-05-10 MED ORDER — POTASSIUM CHLORIDE 10 MEQ/100ML IV SOLN
10.0000 meq | INTRAVENOUS | Status: AC
  Administered 2024-05-10 (×3): 10 meq via INTRAVENOUS
  Filled 2024-05-10 (×3): qty 100

## 2024-05-10 MED ORDER — ATORVASTATIN CALCIUM 40 MG PO TABS
40.0000 mg | ORAL_TABLET | Freq: Every day | ORAL | Status: DC
  Administered 2024-05-10: 40 mg via ORAL
  Filled 2024-05-10: qty 1

## 2024-05-10 MED ORDER — ENOXAPARIN SODIUM 40 MG/0.4ML IJ SOSY
40.0000 mg | PREFILLED_SYRINGE | INTRAMUSCULAR | Status: DC
  Administered 2024-05-10 – 2024-05-13 (×4): 40 mg via SUBCUTANEOUS
  Filled 2024-05-10 (×4): qty 0.4

## 2024-05-10 MED ORDER — ATORVASTATIN CALCIUM 80 MG PO TABS
80.0000 mg | ORAL_TABLET | Freq: Every day | ORAL | Status: DC
  Administered 2024-05-11 – 2024-05-13 (×3): 80 mg via ORAL
  Filled 2024-05-10 (×3): qty 1

## 2024-05-10 NOTE — Progress Notes (Signed)
 Bilateral lower extremity venous duplex has been completed. Preliminary results can be found in CV Proc through chart review.  Results were given to Dr. Jerri.  05/10/24 12:41 PM Cathlyn Collet RVT

## 2024-05-10 NOTE — Progress Notes (Signed)
 Inpatient Rehab Admissions Coordinator:   Per therapy recommendations, patient was screened for CIR candidacy by Megan Salon, MS, CCC-SLP.  At this time, Pt. is not at a level to tolerate the intensity of CIR. However,  Pt. may have potential to progress to becoming a potential CIR candidate, so CIR admissions team will follow and monitor for progress and participation with therapies and place consult order if Pt. appears to be an appropriate candidate. Please contact me with any questions.   Megan Salon, MS, CCC-SLP Rehab Admissions Coordinator  [phone] (celll) [phone] (office)

## 2024-05-10 NOTE — Progress Notes (Signed)
 Speech Language Pathology Treatment: Dysphagia  Patient Details Name: April Sheppard MRN: [id number] DOB: 1945/08/23 Today's Date: 05/10/2024 Time: 8672-8642 SLP Time Calculation (min) (ACUTE ONLY): 30 min  Assessment / Plan / Recommendation Clinical Impression  Pt asleep upon SLP arrival, but awoke immediately with verbal cues. HOB elevation and intermittent verbal cues assisted with optimal alertness throughout tx session. Video interpreter present to assist with communication during and granddaughter at bedside to assist with hx and pt encouragement. With hand over hand assist to hold cup, pt consumed impulsively large sips of thin liquids by straw exhibiting x1 overt cough response across multiple trials. On this occasion, she was noted to be falling asleep with bolus held in oral cavity. When she was aroused, she promptly swallowed, but it was likely delayed and therefore cough was elicited. Subsequest sequential, small and large sips were without s/sx of aspiration. Lengthy oral manipulation and A/P transit of graham cracker observed, with full clearance post swallow. While today's clinical presentation and level of alertness is much improved from initial eval (10/10), recommend proceeding with caution with initation of dys 1 diet/thin liquids, meds whole in puree. Pt will require 1:1 supervision and assist for meals due to ongoing intermittent lethargy. SLP will f/u for tolerance, but please contact SLP team if increased s/sx of aspiration are noted with POs. Discussed with RN and daughter who are in agreement with plan.    HPI HPI: 78yo Vietnamese speaking female admitted 05/08/24 with n/v, generalized weakness. PMH: CAD, STEMI (May 2022), CABG, HTN, HLD, DM2, hypothyroidism. CXR negative. New right facial droop noted. 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 Underlying chronic ischemic disease in April Sheppard, left MCA, and bilateral PCA territories.       SLP Plan  Continue with current plan of care          Recommendations  Diet recommendations: Dysphagia 1 (puree);Thin liquid Liquids provided via: Cup;Straw Medication Administration: Whole meds with puree Supervision: Staff to assist with self feeding;Full supervision/cueing for compensatory strategies Compensations: Minimize environmental distractions;Slow rate;Small sips/bites Postural Changes and/or Swallow Maneuvers: Seated upright 90 degrees                  Oral care BID     Dysphagia, unspecified (R13.10)     Continue with current plan of care      April Kin, MA, CCC-SLP Acute Rehabilitation Services Office Number: [phone]  April Sheppard  05/10/2024, 2:20 PM

## 2024-05-10 NOTE — Evaluation (Signed)
 Physical Therapy Evaluation Patient Details Name: April Sheppard MRN: [id number] DOB: 1945-09-17 Today's Date: 05/10/2024  History of Present Illness  Patient is a 78 y.o.  female who was admitted to [facility] with nausea/vomiting/lethargy-acute urinary retention-she was found to be in DKA-and acute ischemic CVA. Past medical history of CAD s/p CABG, HTN, HLD, DM-2, hypothyroidism.  Clinical Impression  Pt presents with admitting diagnosis above. Pt today noted to be very lethargic. Pt noted with heavy R lateral lean once seated EOB. Pt declining any OOB mobility and adamant about to returning to sleep. Daughter assisting pt with mobility. PTA pt was Mod I furniture walking in April Sheppard house and using RW outside of April Sheppard house. Pt is [place] speaking and daughter was present to interpret. Patient will benefit from intensive inpatient follow-up therapy, >3 hours/day. PT will continue to follow.         If plan is discharge home, recommend April Sheppard following: Two people to help with walking and/or transfers;A lot of help with bathing/dressing/bathroom;Assistance with cooking/housework;Direct supervision/assist for medications management;Direct supervision/assist for financial management;Assist for transportation;Help with stairs or ramp for entrance;Supervision due to cognitive status   Can travel by private vehicle        Equipment Recommendations Wheelchair (measurements PT);Wheelchair cushion (measurements PT);Hospital bed  Recommendations for Other Services  Rehab consult    Functional Status Assessment Patient has had a recent decline in their functional status and demonstrates April Sheppard ability to make significant improvements in function in a reasonable and predictable amount of time.     Precautions / Restrictions Precautions Precautions: Fall Recall of Precautions/Restrictions: Impaired Restrictions Weight Bearing Restrictions Per Provider Order: No      Mobility  Bed Mobility Overal bed mobility:  Needs Assistance Bed Mobility: Supine to Sit, Sit to Supine     Supine to sit: +2 for physical assistance, Mod assist, +2 for safety/equipment Sit to supine: +2 for physical assistance, Max assist   General bed mobility comments: Pt noted with heavy R lateral lean once seated EOB. Pt declining any OOB mobility and adamant about to returning to sleep. Daughter assisting pt with mobility.    Transfers                   General transfer comment: Pt declined    Ambulation/Gait                  Stairs            Wheelchair Mobility     Tilt Bed    Modified Rankin (Stroke Patients Only)       Balance Overall balance assessment: Needs assistance Sitting-balance support: Bilateral upper extremity supported, Feet supported Sitting balance-[name] Scale: Poor Sitting balance - Comments: R lateral lean Postural control: Right lateral lean                                   Pertinent Vitals/Pain Pain Assessment Pain Assessment: Faces Faces Pain Scale: No hurt    Home Living Family/patient expects to be discharged to:: Private residence Living Arrangements: Children Available Help at Discharge: Family;Available 24 hours/day Type of Home: House Home Access: Stairs to enter Entrance Stairs-Rails: None Entrance Stairs-Number of Steps: 1   Home Layout: One level Home Equipment: Agricultural consultant (2 wheels);Cane - single point;Hand held shower head;Grab bars - toilet;Grab bars - tub/shower      Prior Function Prior Level of Function : Independent/Modified Independent  Mobility Comments: Mod I furniture walking/RW outside of April Sheppard house ADLs Comments: Ind     Extremity/Trunk Assessment   Upper Extremity Assessment Upper Extremity Assessment: Generalized weakness    Lower Extremity Assessment Lower Extremity Assessment: Generalized weakness    Cervical / Trunk Assessment Cervical / Trunk Assessment: Kyphotic   Communication   Communication Communication: Other (comment) (non english speaking. Daughter present and provided interpretation)    Cognition Arousal: Lethargic Behavior During Therapy: Flat affect, Agitated   PT - Cognitive impairments: Difficult to assess Difficult to assess due to: Non-English speaking                     PT - Cognition Comments: Pt very lethargic and non english speaking. Following commands: Impaired Following commands impaired: Follows one step commands inconsistently, Follows one step commands with increased time     Cueing Cueing Techniques: Verbal cues, Tactile cues     General Comments General comments (skin integrity, edema, etc.): VSS    Exercises     Assessment/Plan    PT Assessment Patient needs continued PT services  PT Problem List Decreased strength;Decreased activity tolerance;Decreased range of motion;Decreased balance;Decreased mobility;Decreased coordination;Decreased cognition;Decreased knowledge of precautions;Decreased safety awareness;Decreased knowledge of use of DME;Cardiopulmonary status limiting activity       PT Treatment Interventions DME instruction;Gait training;Stair training;Functional mobility training;Therapeutic activities;Therapeutic exercise;Balance training;Neuromuscular re-education;Cognitive remediation;Patient/family education    PT Goals (Current goals can be found in April Sheppard Care Plan section)  Acute Rehab PT Goals PT Goal Formulation: Patient unable to participate in goal setting Time For Goal Achievement: 05/24/24 Potential to Achieve Goals: Fair    Frequency Min 3X/week     Co-evaluation               AM-PAC PT 6 Clicks Mobility  Outcome Measure Help needed turning from your back to your side while in a flat bed without using bedrails?: A Lot Help needed moving from lying on your back to sitting on April Sheppard side of a flat bed without using bedrails?: A Lot Help needed moving to and from a bed  to a chair (including a wheelchair)?: A Lot Help needed standing up from a chair using your arms (e.g., wheelchair or bedside chair)?: Total Help needed to walk in hospital room?: Total Help needed climbing 3-5 steps with a railing? : Total 6 Click Score: 9    End of Session   Activity Tolerance: Patient limited by lethargy Patient left: in bed;with call bell/phone within reach;with bed alarm set;with nursing/sitter in room Nurse Communication: Mobility status PT Visit Diagnosis: Other abnormalities of gait and mobility (R26.89)    Time: 8866-8852 PT Time Calculation (min) (ACUTE ONLY): 14 min   Charges:   PT Evaluation $PT Eval Moderate Complexity: 1 Mod   PT General Charges $$ ACUTE PT VISIT: 1 Visit         Sueellen NOVAK, PT, DPT Acute Rehab Services [phone]   Caedon Bond 05/10/2024, 3:14 PM

## 2024-05-10 NOTE — Progress Notes (Addendum)
 PROGRESS NOTE        PATIENT DETAILS Name: April Sheppard Age: 78 y.o. Sex: female Date of Birth: 11/23/1945 Admit Date: 05/08/2024 Admitting Physician Editha Ram, MD ERE:[id number], Ritchie BROCKS, MD (Inactive)  Brief Summary: Patient is a 78 y.o.  female with history of CAD s/p CABG, HTN, HLD, DM-2, hypothyroidism-who was admitted to [facility] with nausea/vomiting/lethargy-acute urinary retention-she was found to be in DKA-and acute ischemic CVA.  Significant events: 10/9>> admit to [facility] 10/10>> transferred to [facility]  Significant studies: 10/9>> A1c: 13.2 10/10>> CTA head/neck: Left vertebral artery origin occlusion, severe right V2 vertebral artery stenosis, bilateral P2 PCA occlusion, severe left M2 MCA stenosis, left A2 ACA stenosis. 10/10>> MRI brain: Extensive acute posterior circulation infarct in [place] setting of vertebral artery occlusion-right> left PICA territory involvement.+ve cytotoxic edema. 10/10>> echo: EF 60-65%, grade 1 diastolic dysfunction.  Moderate aortic valve regurgitation. 10/11>> LDL: 189.  Significant microbiology data: 10/9>> blood culture: No growth. 10/9>> urine culture: Pending.  Procedures: None  Consults: Neurology.  Subjective: Sleeping-awakes-granddaughter speaks in [place] language-she answers appropriately-speech is slow but clear.  Seems to be moving all 4 extremities but very poor effort/uncooperative as she is not a morning person.  Objective: Vitals: Blood pressure (!) 164/72, pulse 81, temperature 98 F (36.7 C), temperature source Oral, resp. rate 14, weight 55.5 kg, SpO2 97%.   Exam: Gen Exam:not in any distress HEENT:atraumatic, normocephalic Chest: B/L clear to auscultation anteriorly CVS:S1S2 regular Abdomen:soft non tender, non distended Extremities:no edema Neurology: Difficult exam but seems to be moving all 4 extremities. Skin: no rash  Pertinent Labs/Radiology:    Latest Ref Rng & Units 05/10/2024     8:35 AM 05/09/2024    5:37 AM 05/08/2024    2:58 PM  CBC  WBC 4.0 - 10.5 K/uL 16.3  16.4  12.4   Hemoglobin 12.0 - 15.0 g/dL 83.7  85.9  83.1   Hematocrit 36.0 - 46.0 % 45.9  40.1  50.6   Platelets 150 - 400 K/uL 242  235  275     Lab Results  Component Value Date   NA 135 05/09/2024   K 3.4 (L) 05/09/2024   CL 100 05/09/2024   CO2 22 05/09/2024      Assessment/Plan: DKA Known history of DM-2-poor compliance with medications per prior notes. Resolved with IV insulin/IV fluid Now on SQ insulin  Acute B/L PICA territory (right> left) ischemic infarcts with cytotoxic edema Etiology felt to be secondary to severe intracranial atherosclerotic disease. Difficult exam as sleepy/somnolent but/speaking in slow fluent speech-moving all 4 extremities Continue aspirin/Plavix Await SLP/PT/OT eval. Await formal evaluation by stroke MD.  Hypertensive urgency Allow permissive hypertension in [place] setting of acute CVA and severe intracranial atherosclerotic disease. Gradually lower blood pressure in Rozanne next several days.  HLD Start Lipitor if able to take oral intake.  DM-2 with uncontrolled hyperglycemia CBGs currently stable Continue Lantus 5 units daily+ SSI As diet advances-Will need adjustment of insulin regimen.  Hypothyroidism TSH mildly elevated-Synthroid has been increased to 100 mcg Repeat TSH in 4-6 weeks.  CAD s/p CABG No anginal symptoms On antiplatelet/statin  Acute urinary retention Continue indwelling Foley catheter Voiding trial when bed more awake and mobile.  Moderate aortic valve regurgitation Seen on echocardiogram Stable for outpatient follow-up with cardiology.  Hypokalemia Replete/recheck.  Right upper lobe nodular opacities Incidental finding on CT imaging.  Radiology recommending repeat CT chest in 3 to 6 months  Class I obesity: Estimated body mass index is 30.04 kg/m as calculated from Mallerie following:   Height as of 05/17/11: 5' 4  (1.626 m).   Weight as of 05/17/11: 79.4 kg.   Code status:   Code Status: Full Code   DVT Prophylaxis: Place and maintain sequential compression device Start: 05/09/24 1511  Family Communication: Granddaughter at bedside   Disposition Plan: Status is: Inpatient Remains inpatient appropriate because:    Planned Discharge Destination:Skilled nursing facility   Diet: Diet Order             Diet NPO time specified  Diet effective now                     Antimicrobial agents: Anti-infectives (From admission, onward)    Start     Dose/Rate Route Frequency Ordered Stop   05/08/24 2100  cefTRIAXone (ROCEPHIN) 1 g in sodium chloride 0.9 % 100 mL IVPB        1 g 200 mL/hr over 30 Minutes Intravenous  Once 05/08/24 2054 05/08/24 2311   05/08/24 2100  cefTRIAXone (ROCEPHIN) 1 g in sodium chloride 0.9 % 100 mL IVPB  Status:  Discontinued        1 g 200 mL/hr over 30 Minutes Intravenous Every 24 hours 05/08/24 2244 05/09/24 1743        MEDICATIONS: Scheduled Meds:  aspirin  300 mg Rectal Daily   clopidogrel  75 mg Oral Daily   insulin aspart  0-5 Units Subcutaneous QHS   insulin aspart  0-9 Units Subcutaneous Q4H   insulin glargine  5 Units Subcutaneous Q24H   levothyroxine  100 mcg Oral Q0600   Continuous Infusions: PRN Meds:.acetaminophen **OR** acetaminophen, dextrose, hydrALAZINE   I have personally reviewed following labs and imaging studies  LABORATORY DATA: CBC: Recent Labs  Lab 05/08/24 1458 05/09/24 0537 05/10/24 0835  WBC 12.4* 16.4* 16.3*  NEUTROABS 10.4*  --   --   HGB 16.8* 14.0 16.2*  HCT 50.6* 40.1 45.9  MCV 87.1 85.1 84.2  PLT 275 235 242    Basic Metabolic Panel: Recent Labs  Lab 05/08/24 1716 05/08/24 2300 05/09/24 0050 05/09/24 0537 05/09/24 0815  NA 138 137 138 141 135  K 3.8 3.6 3.0* 2.5* 3.4*  CL 97* 102 105 113* 100  CO2 18* 23 22 18* 22  GLUCOSE 326* 191* 219* 101* 167*  BUN 32* 24* 20 14 18   CREATININE 0.84 0.54  0.54 0.37* 0.53  CALCIUM 9.8 9.4 8.5* 6.7* 9.0    GFR: CrCl cannot be calculated (Unknown ideal weight.).  Liver Function Tests: Recent Labs  Lab 05/08/24 1458 05/09/24 0537  AST 22 <10*  ALT 19 5  ALKPHOS 257* 67  BILITOT 1.5* 0.3  PROT 7.7 <3.0*  ALBUMIN 4.6 <1.5*   Recent Labs  Lab 05/08/24 1516  LIPASE 25   No results for input(s): AMMONIA in Rashena last 168 hours.  Coagulation Profile: No results for input(s): INR, PROTIME in Azelie last 168 hours.  Cardiac Enzymes: No results for input(s): CKTOTAL, CKMB, CKMBINDEX, TROPONINI in Jaleisa last 168 hours.  BNP (last 3 results) No results for input(s): PROBNP in Kamron last 8760 hours.  Lipid Profile: Recent Labs    05/10/24 0452  CHOL 266*  HDL 61  LDLCALC 189*  TRIG 78  CHOLHDL 4.4    Thyroid Function Tests: Recent Labs    05/09/24 0537  TSH 8.070*  Anemia Panel: No results for input(s): VITAMINB12, FOLATE, FERRITIN, TIBC, IRON, RETICCTPCT in Laranda last 72 hours.  Urine analysis:    Component Value Date/Time   COLORURINE YELLOW 05/08/2024 1924   APPEARANCEUR CLEAR 05/08/2024 1924   LABSPEC 1.022 05/08/2024 1924   PHURINE 5.0 05/08/2024 1924   GLUCOSEU >=500 (A) 05/08/2024 1924   HGBUR NEGATIVE 05/08/2024 1924   BILIRUBINUR NEGATIVE 05/08/2024 1924   KETONESUR 80 (A) 05/08/2024 1924   PROTEINUR 100 (A) 05/08/2024 1924   UROBILINOGEN 0.2 06/09/2010 1539   NITRITE POSITIVE (A) 05/08/2024 1924   LEUKOCYTESUR SMALL (A) 05/08/2024 1924    Sepsis Labs: Lactic Acid, Venous    Component Value Date/Time   LATICACIDVEN 2.1 (HH) 05/08/2024 2308    MICROBIOLOGY: Recent Results (from Renny past 240 hours)  Urine Culture     Status: None (Preliminary result)   Collection Time: 05/08/24  7:24 PM   Specimen: Urine, Catheterized  Result Value Ref Range Status   Specimen Description   Final    URINE, CATHETERIZED Performed at [facility], [address],  [place], [place] [address]    Special Requests   Final    NONE Performed at [facility], [address] [place], [place] [address]    Culture   Final    CULTURE REINCUBATED FOR BETTER GROWTH Performed at [facility], [address] [place], [place] [address]    Report Status PENDING  Incomplete  Culture, blood (Routine X 2) w Reflex to ID Panel     Status: None (Preliminary result)   Collection Time: 05/08/24 10:49 PM   Specimen: BLOOD  Result Value Ref Range Status   Specimen Description   Final    BLOOD RIGHT ANTECUBITAL Performed at [facility], [address] [place], [place] [address]    Special Requests   Final    Blood Culture results may not be optimal due to an inadequate volume of blood received in culture bottles BOTTLES DRAWN AEROBIC AND ANAEROBIC Performed at [facility], [address], [place], [place] [address]    Culture   Final    NO GROWTH 1 DAY Performed at [facility], [address] [place], [place] [address]    Report Status PENDING  Incomplete    RADIOLOGY STUDIES/RESULTS: ECHOCARDIOGRAM COMPLETE Result Date: 05/09/2024    ECHOCARDIOGRAM REPORT   Patient Name:   Tiffany Skalla     Date of Exam: 05/09/2024 Medical Rec #:  [id number]  Height:       64.0 in Accession #:    [id number] Weight:       175.0 lb Date of Birth:  03/06/1946 BSA:          1.848 m Patient Age:    77 years   BP:           171/63 mmHg Patient Gender: F          HR:           65 bpm. Exam Location:  Inpatient Procedure: 2D Echo, 3D Echo, Cardiac Doppler, Color Doppler and Strain Analysis            (Both Spectral and Color Flow Doppler were utilized during            procedure). Indications:    I35.0 Nonrheumatic aortic (valve) stenosis  History:        Patient has no prior history of Echocardiogram examinations. CAD  and Previous Myocardial Infarction, Prior CABG, Stroke; Risk                 Factors:Hypertension,  Dyslipidemia and Diabetes.  Sonographer:    Ellouise Mose RDCS Referring Phys: [id number] VASUNDHRA RATHORE IMPRESSIONS  1. Left ventricular ejection fraction, by estimation, is 60 to 65%. Aslan left ventricle has normal function. Lateia left ventricle demonstrates regional wall motion abnormalities (see scoring diagram/findings for description). There is severe concentric left ventricular hypertrophy. Left ventricular diastolic parameters are consistent with Grade I diastolic dysfunction (impaired relaxation). There is mild hypokinesis of Chastity left ventricular, basal-mid anterolateral wall. Caitlynn average left ventricular global longitudinal strain is -15.5 %. Edelyn global longitudinal strain is abnormal.  2. Right ventricular systolic function is normal. Chonda right ventricular size is normal.  3. Left atrial size was mildly dilated.  4. Teosha mitral valve is degenerative. Trivial mitral valve regurgitation. No evidence of mitral stenosis. Severe mitral annular calcification.  5. Bryah aortic valve is tricuspid. There is moderate calcification of Jaeanna aortic valve. There is moderate thickening of Ilana aortic valve. Aortic valve regurgitation is moderate. Aortic valve sclerosis/calcification is present, without any evidence of aortic stenosis.  6. Tangia inferior vena cava is normal in size with greater than 50% respiratory variability, suggesting right atrial pressure of 3 mmHg. Comparison(s): No prior Echocardiogram. FINDINGS  Left Ventricle: Left ventricular ejection fraction, by estimation, is 60 to 65%. Meigan left ventricle has normal function. Dalanie left ventricle demonstrates regional wall motion abnormalities. Mild hypokinesis of Carlotta left ventricular, basal-mid anterolateral wall. Jaziah average left ventricular global longitudinal strain is -15.5 %. Strain was performed and Latori global longitudinal strain is abnormal. 3D ejection fraction reviewed and evaluated as part of Othell interpretation. Alternate measurement of EF is felt to be most  reflective of LV function. Shiasia left ventricular internal cavity size was normal in size. There is severe concentric left ventricular hypertrophy. Left ventricular diastolic parameters are consistent with Grade I diastolic dysfunction (impaired relaxation). Right Ventricle: Chanda right ventricular size is normal. No increase in right ventricular wall thickness. Right ventricular systolic function is normal. Left Atrium: Left atrial size was mildly dilated. Right Atrium: Right atrial size was normal in size. Pericardium: There is no evidence of pericardial effusion. Mitral Valve: Mobile calcifications present on chordae. Chavela mitral valve is degenerative in appearance. Severe mitral annular calcification. Trivial mitral valve regurgitation. No evidence of mitral valve stenosis. Tricuspid Valve: Wynell tricuspid valve is normal in structure. Tricuspid valve regurgitation is trivial. No evidence of tricuspid stenosis. Aortic Valve: Jalilah aortic valve is tricuspid. There is moderate calcification of Mikinzie aortic valve. There is moderate thickening of Sheniya aortic valve. Aortic valve regurgitation is moderate. Aortic regurgitation PHT measures 419 msec. Aortic valve sclerosis/calcification is present, without any evidence of aortic stenosis. Aortic valve mean gradient measures 6.8 mmHg. Aortic valve peak gradient measures 12.8 mmHg. Aortic valve area, by VTI measures 2.38 cm. Pulmonic Valve: Abryanna pulmonic valve was not well visualized. Pulmonic valve regurgitation is not visualized. No evidence of pulmonic stenosis. Aorta: Lezli aortic root, ascending aorta, aortic arch and descending aorta are all structurally normal, with no evidence of dilitation or obstruction. Venous: Miel left upper pulmonary vein is normal. Katori inferior vena cava is normal in size with greater than 50% respiratory variability, suggesting right atrial pressure of 3 mmHg. IAS/Shunts: No atrial level shunt detected by color flow Doppler. Additional Comments: 3D was  performed not requiring image post processing on an independent workstation and was indeterminate.  LEFT VENTRICLE PLAX 2D LVIDd:         4.20 cm     Diastology LVIDs:         2.70 cm     LV e' medial:    3.15 cm/s LV PW:         1.83 cm     LV E/e' medial:  24.9 LV IVS:        1.73 cm     LV e' lateral:   3.26 cm/s LVOT diam:     2.20 cm     LV E/e' lateral: 24.1 LV SV:         84 LV SV Index:   45          2D Longitudinal Strain LVOT Area:     3.80 cm    2D Strain GLS (A4C):   -17.5 % LV IVRT:       85 msec     2D Strain GLS (A3C):   -12.6 %                            2D Strain GLS (A2C):   -16.3 %                            2D Strain GLS Avg:     -15.5 % LV Volumes (MOD) LV vol d, MOD A2C: 77.5 ml LV vol d, MOD A4C: 59.5 ml LV vol s, MOD A2C: 31.7 ml LV vol s, MOD A4C: 25.0 ml LV SV MOD A2C:     45.8 ml LV SV MOD A4C:     59.5 ml LV SV MOD BP:      40.7 ml RIGHT VENTRICLE             IVC RV S prime:     10.80 cm/s  IVC diam: 1.70 cm TAPSE (M-mode): 1.8 cm LEFT ATRIUM           Index        RIGHT ATRIUM           Index LA diam:      3.70 cm 2.00 cm/m   RA Area:     11.80 cm LA Vol (A2C): 35.5 ml 19.21 ml/m  RA Volume:   23.00 ml  12.44 ml/m LA Vol (A4C): 38.4 ml 20.75 ml/m  AORTIC VALVE AV Area (Vmax):    2.36 cm AV Area (Vmean):   2.34 cm AV Area (VTI):     2.38 cm AV Vmax:           179.00 cm/s AV Vmean:          116.250 cm/s AV VTI:            0.353 m AV Peak Grad:      12.8 mmHg AV Mean Grad:      6.8 mmHg LVOT Vmax:         111.00 cm/s LVOT Vmean:        71.700 cm/s LVOT VTI:          0.221 m LVOT/AV VTI ratio: 0.63 AI PHT:            419 msec  AORTA Ao Root diam: 2.90 cm Ao Asc diam:  3.50 cm MITRAL VALVE MV Area (PHT): 2.76 cm     SHUNTS MV Decel Time: 275 msec     Systemic VTI:  0.22 m  MV E velocity: 78.50 cm/s   Systemic Diam: 2.20 cm MV A velocity: 114.00 cm/s MV E/A ratio:  0.69 Shelda Bruckner MD Electronically signed by Shelda Bruckner MD Signature Date/Time: 05/09/2024/8:08:03  PM    Final    MR BRAIN WO CONTRAST Result Date: 05/09/2024 EXAM: MR Brain without Intravenous Contrast. CLINICAL HISTORY: Transient ischemic attack (TIA), abnormal CT, MRI requested by radiologist. Patient not alert and oriented, used Fast Brain Protocol. TECHNIQUE: Magnetic resonance images of Tanith brain without intravenous contrast in multiple planes. CONTRAST: Without. COMPARISON: Head CT 05/09/2024, CTA head and neck 05/08/2024, and CT head 05/08/2024. FINDINGS: BRAIN: Extensive acute posterior circulation infarcts in Maebry setting of vertebral artery occlusion. This includes confluent restricted diffusion throughout Janaiya right cerebellar PICA territory, moderate patchy restricted diffusion in Leta contralateral left cerebellum PICA territory, associated right lateral medullary diffusion restricted infarction, and associated bifalamic infarcts with restricted diffusion. Early T2 and FLAIR hyperintense cytotoxic edema is present in Meekah acutely affected areas. There is no hemorrhagic transformation or mass effect. No other diffusion restriction is identified. Underlying chronic cerebellar infarcts are more apparent on Aerabella left. Chronic microhemorrhage is present in Jaleiah right superior frontal gyrus. Chronic encephalomalacia is noted in Finleigh anterior left middle and superior frontal gyri, right greater than left occipital poles, left inferior occipital lobe, and posteromesial left temporal lobe, as seen by CT. Superimposed chronic lacunar infarcts are present in Zyra left basal ganglia. Underlying chronic ischemic disease involves Shawneen left MCA and bilateral PCA territories. No intracranial mass. No midline shift or extra-axial fluid collection. No cerebellar tonsillar ectopia. Grossly preserved major vascular flow voids; see CTA findings yesterday. VENTRICLES: No hydrocephalus. ORBITS: Akita orbits are normal. SINUSES AND MASTOIDS: Julie-Anne sinuses and mastoid air cells are clear. BONES: No acute fracture or focal osseous  lesion. IMPRESSION: 1. Extensive acute posterior circulation infarcts in Analeigh setting of vertebral artery occlusion: right >left PICA territory involvement, right lateral medullary, and bifalamic involvement. 2. Cytotoxic edema without hemorrhagic transformation or mass effect. 3. Underlying chronic ischemic disease in Kashae cerebellum, left MCA, and bilateral PCA territories. Electronically signed by: Helayne Hurst MD 05/09/2024 07:33 AM EDT RP Workstation: [id number]   CT HEAD WO CONTRAST ( ) Result Date: 05/09/2024 EXAM: CT HEAD WITHOUT CONTRAST 05/09/2024 04:37:23 AM TECHNIQUE: CT of Lashay head was performed without Molley administration of intravenous contrast. Automated exposure control, iterative reconstruction, and/or weight based adjustment of Kynesha mA/kV was utilized to reduce Patrena radiation dose to as low as reasonably achievable. COMPARISON: Head CT and CTA head and neck 05/08/2024. Head CT 10/03/2021. CLINICAL HISTORY: 78 year old female with neuro deficit, acute, stroke suspected. Change in mental status since previous CT angio head. 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 Evidence of cerebellar infarcts on head CT 05/08/2024. FINDINGS: BRAIN AND VENTRICLES: Overall cerebral volume stable from 2023 head CT with unchanged chronic encephalomalacia in Eiliyah bilateral PCA territories and anterior left middle frontal gyrus since that time. Multifocal deep perianal lacunar infarcts are stable from 05/08/2024. Patchy and confluent right greater than left inferior cerebellar infarcts redemonstrated and stable, right PICA territory involvement actually make that right PICA territory most affected. Circumscribed and chronic round 7 mm left mid cerebellar infarct (coronal image 54) which was present in 2023. No intracranial mass effect or ventriculomegaly. Some residual intravascular contrast. No acute intracranial hemorrhage identified. ORBITS: No acute abnormality. SINUSES: No acute abnormality. SOFT TISSUES AND SKULL: No  acute soft tissue abnormality. No skull fracture. Advanced calcified atherosclerosis at Dail skull base. IMPRESSION: 1.  Unchanged noncontrast CT appearance of Mayra brain since yesterday. 2. Patchy and confluent right greater than left inferior cerebellar infarcts, no hemorrhagic transformation or mass effect 3. Underlying advanced chronic ischemic disease. Electronically signed by: Helayne Hurst MD 05/09/2024 04:46 AM EDT RP Workstation: [id number]   CT ANGIO HEAD NECK W WO CM Result Date: 05/09/2024 EXAM: CTA HEAD AND NECK WITHOUT AND WITH IV CONTRAST 05/08/2024 11:55:00 PM TECHNIQUE: CTA of Rylin head and neck was performed without and with Stephany administration of 75mL iohexol (OMNIPAQUE) 350 MG/ML injection 75 mL IOHEXOL 350 MG/ML SOLN intravenous contrast. Multiplanar 2D and/or 3D reformatted images are provided for review. Automated exposure control, iterative reconstruction, and/or weight based adjustment of Lakitha mA/kV was utilized to reduce Keir radiation dose to as low as reasonably achievable. Stenosis of Silena internal carotid arteries measured using NASCET criteria. COMPARISON: Same day CT head CLINICAL HISTORY: Transient ischemic attack (TIA). FINDINGS: AORTIC ARCH AND ARCH VESSELS: Mylani origins are patent. Aortic atherosclerosis. CERVICAL CAROTID ARTERIES: No dissection, arterial injury, or hemodynamically significant stenosis by NASCET criteria. CERVICAL VERTEBRAL ARTERIES: Occluded Left vertebral artery origin with nonopacification throughout Amen neck. Severe right v2 vertebral artery stenosis. SABRA LUNGS AND MEDIASTINUM: Unremarkable. SOFT TISSUES: No acute abnormality. BONES: No acute abnormality. ANTERIOR CIRCULATION: Severe left and moderate right intracranial atherosclerosis. No significant stenosis of Jadeyn anterior cerebral arteries. Severe left M2 MCA stenosis. Severely stenotic vs occluded mid right M2 MCA with distal reconstitution. No aneurysm. POSTERIOR CIRCULATION: Severe atherosclerosis of bilateral  vertebral arteries with occlusion of Jayli distal right vertebral artery and nonopacification of Jazaria left vertebral artery. Reconstitution of Pierrette basilar artery, which is severely diseased by atherosclerosis as well with severe stenosis and small caliber. Yocheved PCAs are small proximally with bilateral p2 PCA occlusion. OTHER: No dural venous sinus thrombosis on this non-dedicated study. IMPRESSION: 1. Left vertebral artery origin occlusion with nonopacification in Eriel neck and intradurally. 2. Severe right V2 vertebral artery stenosis with occlusion of Michell right intradural vertebral artery. 3. Bilateral P2 PCA occlusion. 4. Small and severely stenotic basilar artery. 5. Severely stenotic vs occluded mid right M2 MCA with distal reconstitution. 6. Severe left and moderate right intracranial ICA stenosis. 7. Severe left M2 MCA stenosis. 8. Severe left A2 ACA stenosis. 9. Right upper lobe nodular opacities, most likely infectious/inflammatory. Non-contrast chest CT at 3-6 months is recommended. If Angee nodules are stable at time of repeat CT, then future CT at 18-24 months (from today's scan) is considered optional for low-risk patients, but is recommended for high-risk patients. This recommendation follows Kashana consensus statement: Guidelines for Management of Incidental Pulmonary Nodules Detected on CT Images: From [place] Fleischner Society 2017; Radiology 2017; [id number]. Findings discussed with PA Carlo Cornish via telephone at 1:08 AM Electronically signed by: Gilmore Molt MD 05/09/2024 01:10 AM EDT RP Workstation: [id number]   CT Head Wo Contrast Addendum Date: 05/08/2024 ADDENDUM REPORT: 05/08/2024 19:20 ADDENDUM: These results were called by telephone at Lakaya time of interpretation on 05/08/2024 at 7:20 pm to provider RILEY RANSOM , who verbally acknowledged these results. Electronically Signed   By: Morgane  Naveau M.D.   On: 05/08/2024 19:20   Result Date: 05/08/2024 CLINICAL DATA:  Headache, new onset (Age >=  51y) EXAM: CT HEAD WITHOUT CONTRAST TECHNIQUE: Contiguous axial images were obtained from Timira base of Javeah skull through Lakeisa vertex without intravenous contrast. RADIATION DOSE REDUCTION: This exam was performed according to Vee departmental dose-optimization program which includes automated exposure control, adjustment of Kanija mA and/or kV according  to patient size and/or use of iterative reconstruction technique. COMPARISON:  CT head 10/03/2021 FINDINGS: Brain: Interval development of loss of gray-white matter differentiation with associated vasogenic edema of Lexxus bilateral cerebellar hemispheres, right greater than left. Underlying chronic bilateral cerebellar infarctions. Left frontal and right occipital encephalomalacia. Patchy and confluent areas of decreased attenuation are noted throughout Margurette deep and periventricular white matter of Hidaya cerebral hemispheres bilaterally, compatible with chronic microvascular ischemic disease. No parenchymal hemorrhage. No mass lesion. No extra-axial collection. No mass effect or midline shift. No hydrocephalus. Basilar cisterns are patent. Vascular: No hyperdense vessel. Atherosclerotic calcifications are present within Bora cavernous internal carotid and vertebral arteries. Skull: No acute fracture or focal lesion. Sinuses/Orbits: Paranasal sinuses and mastoid air cells are clear. Audrie orbits are unremarkable. Other: None. IMPRESSION: 1. 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 Recommend MRI without contrast for further evaluation. 2. No acute intracranial hemorrhage. Electronically Signed: By: Morgane  Naveau M.D. On: 05/08/2024 19:17   CT ABDOMEN PELVIS W CONTRAST Result Date: 05/08/2024 CLINICAL DATA:  LLQ abdominal pain EXAM: CT ABDOMEN AND PELVIS WITH CONTRAST TECHNIQUE: Multidetector CT imaging of Nichoel abdomen and pelvis was performed using Casia standard protocol following bolus administration of intravenous contrast.  RADIATION DOSE REDUCTION: This exam was performed according to Kayslee departmental dose-optimization program which includes automated exposure control, adjustment of Karlene mA and/or kV according to patient size and/or use of iterative reconstruction technique. CONTRAST:  100mL OMNIPAQUE IOHEXOL 300 MG/ML  SOLN COMPARISON:  None Available. FINDINGS: Lower chest: Coronary artery calcification. Mitral annular calcification. Aortic valve leaflet calcification. Atherosclerotic plaque. Hepatobiliary: Talana hepatic parenchyma is diffusely hypodense compared to Niasha splenic parenchyma consistent with fatty infiltration. No focal liver abnormality. No gallstones, gallbladder wall thickening, or pericholecystic fluid. No biliary dilatation. Pancreas: No focal lesion. Normal pancreatic contour. No surrounding inflammatory changes. No main pancreatic ductal dilatation. Spleen: Normal in size without focal abnormality.  Splenule noted Adrenals/Urinary Tract: No adrenal nodule bilaterally. Bilateral kidneys enhance symmetrically. No hydronephrosis. No hydroureter. Natalea urinary bladder is distended with urine. On delayed imaging, there is no urothelial wall thickening and there are no filling defects in Zaley opacified portions of Esmee bilateral collecting systems or ureters. Stomach/Bowel: Stomach is within normal limits. No evidence of bowel wall thickening or dilatation. Colonic diverticulosis. Appendix appears normal. Vascular/Lymphatic: No abdominal aorta or iliac aneurysm. Severe atherosclerotic plaque of Deryl aorta and its branches. No abdominal, pelvic, or inguinal lymphadenopathy. Reproductive: Uterus and bilateral adnexa are unremarkable. Other: No intraperitoneal free fluid. No intraperitoneal free gas. No organized fluid collection. Musculoskeletal: No abdominal wall hernia or abnormality. Diffusely decreased bone density. No suspicious lytic or blastic osseous lesions. No acute displaced fracture. Multilevel degenerative changes  of Sheron spine. IMPRESSION: 1. No acute intra-abdominal or intrapelvic abnormality. 2. Hepatic steatosis. 3. Colonic diverticulosis. 4. Aortic Atherosclerosis (ICD10-I70.0) including mitral annular, coronary artery, aortic valve leaflet calcifications-correlate for aortic stenosis. Electronically Signed   By: Morgane  Naveau M.D.   On: 05/08/2024 19:12   DG Chest 2 View Result Date: 05/08/2024 CLINICAL DATA:  Hypertension. Hyperglycemia. Unwell feeling over Orra past few days. Dizziness and weakness. Vomiting. EXAM: CHEST - 2 VIEW COMPARISON:  11/05/2023 FINDINGS: Postoperative changes in Naiara mediastinum. Heart size and pulmonary vascularity are normal for technique. Lungs are clear. No pleural effusion or pneumothorax. Mediastinal contours appear intact. Calcification of Antigone aorta. Degenerative changes in Jamecia spine. IMPRESSION: No active cardiopulmonary disease. Electronically Signed   By: Elsie Gravely M.D.   On:  05/08/2024 16:51     LOS: 1 day   Donalda Applebaum, MD  [facility]    To contact Electra attending provider between 7A-7P or Lennette covering provider during after hours 7P-7A, please log into [place]ia web site [email or web address] and access using universal [place] password for that web site. If you do not have Coreen password, please call Danylah hospital operator.  05/10/2024, 9:18 AM

## 2024-05-10 NOTE — Progress Notes (Signed)
 STROKE TEAM PROGRESS NOTE   SUBJECTIVE (INTERVAL HISTORY) Her daughter is at [facility] bedside.  Daughter served as Engineer, technical sales. Overall her condition is stable. Worked with PT and OT and recommend CIR. Daughter said she takes BP meds at home only if BP was high on checking, not taking regularly.    OBJECTIVE Temp:  [97.6 F (36.4 C)-98.2 F (36.8 C)] 97.9 F (36.6 C) (10/11 1200) Pulse Rate:  [62-81] 74 (10/11 1700) Cardiac Rhythm: Normal sinus rhythm (10/11 0813) Resp:  [11-16] 16 (10/11 1700) BP: (150-210)/(51-99) 150/51 (10/11 1700) SpO2:  [96 %-99 %] 98 % (10/11 1700) Weight:  [55.5 kg] 55.5 kg (10/10 2048)  Recent Labs  Lab 05/09/24 2232 05/10/24 0427 05/10/24 0759 05/10/24 1155 05/10/24 1622  GLUCAP 136* 125* 128* 182* 239*   Recent Labs  Lab 05/08/24 2300 05/09/24 0050 05/09/24 0537 05/09/24 0815 05/10/24 0835  NA 137 138 141 135 132*  K 3.6 3.0* 2.5* 3.4* 3.1*  CL 102 105 113* 100 98  CO2 23 22 18* 22 20*  GLUCOSE 191* 219* 101* 167* 120*  BUN 24* 20 14 18 13   CREATININE 0.54 0.54 0.37* 0.53 0.46  CALCIUM 9.4 8.5* 6.7* 9.0 8.6*   Recent Labs  Lab 05/08/24 1458 05/09/24 0537 05/10/24 0835  AST 22 <10* 27  ALT 19 5 16   ALKPHOS 257* 67 165*  BILITOT 1.5* 0.3 1.5*  PROT 7.7 <3.0* 5.9*  ALBUMIN 4.6 <1.5* 3.1*   Recent Labs  Lab 05/08/24 1458 05/09/24 0537 05/10/24 0835  WBC 12.4* 16.4* 16.3*  NEUTROABS 10.4*  --   --   HGB 16.8* 14.0 16.2*  HCT 50.6* 40.1 45.9  MCV 87.1 85.1 84.2  PLT 275 235 242   No results for input(s): CKTOTAL, CKMB, CKMBINDEX, TROPONINI in [place] last 168 hours. No results for input(s): LABPROT, INR in [place] last 72 hours. Recent Labs    05/08/24 1924  COLORURINE YELLOW  LABSPEC 1.022  PHURINE 5.0  GLUCOSEU >=500*  HGBUR NEGATIVE  BILIRUBINUR NEGATIVE  KETONESUR 80*  PROTEINUR 100*  NITRITE POSITIVE*  LEUKOCYTESUR SMALL*       Component Value Date/Time   CHOL 266 (H) 05/10/2024 0452   TRIG 78 05/10/2024  0452   HDL 61 05/10/2024 0452   CHOLHDL 4.4 05/10/2024 0452   VLDL 16 05/10/2024 0452   LDLCALC 189 (H) 05/10/2024 0452   Lab Results  Component Value Date   HGBA1C 13.2 (H) 05/08/2024   No results found for: LABOPIA, COCAINSCRNUR, LABBENZ, AMPHETMU, THCU, LABBARB  No results for input(s): ETH in [place] last 168 hours.  I have personally reviewed Melania radiological images below and agree with Glynnis radiology interpretations.  VAS US  LOWER EXTREMITY VENOUS (DVT) Result Date: 05/10/2024  Lower Venous DVT Study Patient Name:  Keymiah Solorzano      Date of Exam:   05/10/2024 Medical Rec #: [id number]   Accession #:    [id number] Date of Birth: [birth year]  Patient Gender: F Patient Age:   78 years Exam Location:  [facility] Procedure:      VAS US  LOWER EXTREMITY VENOUS (DVT) Referring Phys: ARY Anaya Bovee --------------------------------------------------------------------------------  Indications: Stroke.  Risk Factors: None identified. Limitations: Poor ultrasound/tissue interface and patient positioning. Comparison Study: No prior studies. Performing Technologist: Cordella Collet RVT  Examination Guidelines: A complete evaluation includes B-mode imaging, spectral Doppler, color Doppler, and power Doppler as needed of all accessible portions of each vessel. Bilateral testing is considered an integral part of a complete examination. Limited examinations  for reoccurring indications may be performed as noted. Berdie reflux portion of Adreanna exam is performed with Maripat patient in reverse Trendelenburg.  +---------+---------------+---------+-----------+----------+--------------+ RIGHT    CompressibilityPhasicitySpontaneityPropertiesThrombus Aging +---------+---------------+---------+-----------+----------+--------------+ CFV      Full           Yes      Yes                                 +---------+---------------+---------+-----------+----------+--------------+ SFJ      Full                                                         +---------+---------------+---------+-----------+----------+--------------+ FV Prox  Full                                                        +---------+---------------+---------+-----------+----------+--------------+ FV Mid   Full                                                        +---------+---------------+---------+-----------+----------+--------------+ FV DistalFull                                                        +---------+---------------+---------+-----------+----------+--------------+ PFV      Full                                                        +---------+---------------+---------+-----------+----------+--------------+ POP      Full           Yes      Yes                                 +---------+---------------+---------+-----------+----------+--------------+ PTV      Full                                                        +---------+---------------+---------+-----------+----------+--------------+ PERO     Full                                                        +---------+---------------+---------+-----------+----------+--------------+ 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 Monophasic flow is noted in Kadisha popliteal, posterior tibial, peroneal, and anterior tibial arteries.  +---------+---------------+---------+-----------+----------+--------------+  LEFT     CompressibilityPhasicitySpontaneityPropertiesThrombus Aging +---------+---------------+---------+-----------+----------+--------------+ CFV      Full           Yes      Yes                                 +---------+---------------+---------+-----------+----------+--------------+ SFJ      Full                                                        +---------+---------------+---------+-----------+----------+--------------+ FV Prox  Full                                                         +---------+---------------+---------+-----------+----------+--------------+ FV Mid   Full                                                        +---------+---------------+---------+-----------+----------+--------------+ FV DistalFull                                                        +---------+---------------+---------+-----------+----------+--------------+ PFV      Full                                                        +---------+---------------+---------+-----------+----------+--------------+ POP      Full           Yes      Yes                                 +---------+---------------+---------+-----------+----------+--------------+ PTV      Full                                                        +---------+---------------+---------+-----------+----------+--------------+ PERO     Full                                                        +---------+---------------+---------+-----------+----------+--------------+ Arterial occlusion is noted in Shauntia mid femoral artery. Monophaisc flow is noted in Zurisadai distal femoral, popliteal, posterior tibial, peroneal, and anterior tibial arteries.   Summary: RIGHT: - There is no evidence of deep vein thrombosis in Shayleigh lower extremity.  - No cystic structure  found in Amariya popliteal fossa. - Arterial occlusion is noted in Tysheka distal femoral artery. Monophasic flow is noted in Anayi popliteal, posterior tibial, peroneal, and anterior tibial arteries.  LEFT: - There is no evidence of deep vein thrombosis in Jinx lower extremity.  - No cystic structure found in Emerly popliteal fossa. - Arterial occlusion is noted in Oceania mid femoral artery. Monophaisc flow is noted in Tonjia distal femoral, popliteal, posterior tibial, peroneal, and anterior tibial arteries.  *See table(s) above for measurements and observations.    Preliminary    ECHOCARDIOGRAM COMPLETE Result Date: 05/09/2024    ECHOCARDIOGRAM REPORT   Patient Name:   Angelise Mcdill      Date of Exam: 05/09/2024 Medical Rec #:  [id number]  Height:       64.0 in Accession #:    [id number] Weight:       175.0 lb Date of Birth:  01/17/1946 BSA:          1.848 m Patient Age:    41 years   BP:           171/63 mmHg Patient Gender: F          HR:           65 bpm. Exam Location:  Inpatient Procedure: 2D Echo, 3D Echo, Cardiac Doppler, Color Doppler and Strain Analysis            (Both Spectral and Color Flow Doppler were utilized during            procedure). Indications:    I35.0 Nonrheumatic aortic (valve) stenosis  History:        Patient has no prior history of Echocardiogram examinations. CAD                 and Previous Myocardial Infarction, Prior CABG, Stroke; Risk                 Factors:Hypertension, Dyslipidemia and Diabetes.  Sonographer:    Ellouise Mose RDCS Referring Phys: [id number] VASUNDHRA RATHORE IMPRESSIONS  1. Left ventricular ejection fraction, by estimation, is 60 to 65%. Wilhelmina left ventricle has normal function. Miriam left ventricle demonstrates regional wall motion abnormalities (see scoring diagram/findings for description). There is severe concentric left ventricular hypertrophy. Left ventricular diastolic parameters are consistent with Grade I diastolic dysfunction (impaired relaxation). There is mild hypokinesis of Anab left ventricular, basal-mid anterolateral wall. Kanija average left ventricular global longitudinal strain is -15.5 %. [place] global longitudinal strain is abnormal.  2. Right ventricular systolic function is normal. Bridey right ventricular size is normal.  3. Left atrial size was mildly dilated.  4. Jenea mitral valve is degenerative. Trivial mitral valve regurgitation. No evidence of mitral stenosis. Severe mitral annular calcification.  5. Casidee aortic valve is tricuspid. There is moderate calcification of Karlen aortic valve. There is moderate thickening of Jennalynn aortic valve. Aortic valve regurgitation is moderate. Aortic valve sclerosis/calcification is present, without any evidence  of aortic stenosis.  6. Jonathon inferior vena cava is normal in size with greater than 50% respiratory variability, suggesting right atrial pressure of 3 mmHg. Comparison(s): No prior Echocardiogram. FINDINGS  Left Ventricle: Left ventricular ejection fraction, by estimation, is 60 to 65%. Ikram left ventricle has normal function. Perlita left ventricle demonstrates regional wall motion abnormalities. Mild hypokinesis of Makinzi left ventricular, basal-mid anterolateral wall. Maire average left ventricular global longitudinal strain is -15.5 %. Strain was performed and Joie global longitudinal strain is abnormal. 3D ejection fraction reviewed and evaluated as  part of Abygayle interpretation. Alternate measurement of EF is felt to be most reflective of LV function. Mkenzie left ventricular internal cavity size was normal in size. There is severe concentric left ventricular hypertrophy. Left ventricular diastolic parameters are consistent with Grade I diastolic dysfunction (impaired relaxation). Right Ventricle: Kaliyan right ventricular size is normal. No increase in right ventricular wall thickness. Right ventricular systolic function is normal. Left Atrium: Left atrial size was mildly dilated. Right Atrium: Right atrial size was normal in size. Pericardium: There is no evidence of pericardial effusion. Mitral Valve: Mobile calcifications present on chordae. Saja mitral valve is degenerative in appearance. Severe mitral annular calcification. Trivial mitral valve regurgitation. No evidence of mitral valve stenosis. Tricuspid Valve: Deolinda tricuspid valve is normal in structure. Tricuspid valve regurgitation is trivial. No evidence of tricuspid stenosis. Aortic Valve: Shantice aortic valve is tricuspid. There is moderate calcification of Earlena aortic valve. There is moderate thickening of Mykal aortic valve. Aortic valve regurgitation is moderate. Aortic regurgitation PHT measures 419 msec. Aortic valve sclerosis/calcification is present, without any evidence  of aortic stenosis. Aortic valve mean gradient measures 6.8 mmHg. Aortic valve peak gradient measures 12.8 mmHg. Aortic valve area, by VTI measures 2.38 cm. Pulmonic Valve: Rakiya pulmonic valve was not well visualized. Pulmonic valve regurgitation is not visualized. No evidence of pulmonic stenosis. Aorta: Kalee aortic root, ascending aorta, aortic arch and descending aorta are all structurally normal, with no evidence of dilitation or obstruction. Venous: Johnay left upper pulmonary vein is normal. Tiana inferior vena cava is normal in size with greater than 50% respiratory variability, suggesting right atrial pressure of 3 mmHg. IAS/Shunts: No atrial level shunt detected by color flow Doppler. Additional Comments: 3D was performed not requiring image post processing on an independent workstation and was indeterminate.  LEFT VENTRICLE PLAX 2D LVIDd:         4.20 cm     Diastology LVIDs:         2.70 cm     LV e' medial:    3.15 cm/s LV PW:         1.83 cm     LV E/e' medial:  24.9 LV IVS:        1.73 cm     LV e' lateral:   3.26 cm/s LVOT diam:     2.20 cm     LV E/e' lateral: 24.1 LV SV:         84 LV SV Index:   45          2D Longitudinal Strain LVOT Area:     3.80 cm    2D Strain GLS (A4C):   -17.5 % LV IVRT:       85 msec     2D Strain GLS (A3C):   -12.6 %                            2D Strain GLS (A2C):   -16.3 %                            2D Strain GLS Avg:     -15.5 % LV Volumes (MOD) LV vol d, MOD A2C: 77.5 ml LV vol d, MOD A4C: 59.5 ml LV vol s, MOD A2C: 31.7 ml LV vol s, MOD A4C: 25.0 ml LV SV MOD A2C:     45.8 ml LV SV MOD A4C:  59.5 ml LV SV MOD BP:      40.7 ml RIGHT VENTRICLE             IVC RV S prime:     10.80 cm/s  IVC diam: 1.70 cm TAPSE (M-mode): 1.8 cm LEFT ATRIUM           Index        RIGHT ATRIUM           Index LA diam:      3.70 cm 2.00 cm/m   RA Area:     11.80 cm LA Vol (A2C): 35.5 ml 19.21 ml/m  RA Volume:   23.00 ml  12.44 ml/m LA Vol (A4C): 38.4 ml 20.75 ml/m  AORTIC VALVE AV Area  (Vmax):    2.36 cm AV Area (Vmean):   2.34 cm AV Area (VTI):     2.38 cm AV Vmax:           179.00 cm/s AV Vmean:          116.250 cm/s AV VTI:            0.353 m AV Peak Grad:      12.8 mmHg AV Mean Grad:      6.8 mmHg LVOT Vmax:         111.00 cm/s LVOT Vmean:        71.700 cm/s LVOT VTI:          0.221 m LVOT/AV VTI ratio: 0.63 AI PHT:            419 msec  AORTA Ao Root diam: 2.90 cm Ao Asc diam:  3.50 cm MITRAL VALVE MV Area (PHT): 2.76 cm     SHUNTS MV Decel Time: 275 msec     Systemic VTI:  0.22 m MV E velocity: 78.50 cm/s   Systemic Diam: 2.20 cm MV A velocity: 114.00 cm/s MV E/A ratio:  0.69 Shelda Bruckner MD Electronically signed by Shelda Bruckner MD Signature Date/Time: 05/09/2024/8:08:03 PM    Final    MR BRAIN WO CONTRAST Result Date: 05/09/2024 EXAM: MR Brain without Intravenous Contrast. CLINICAL HISTORY: Transient ischemic attack (TIA), abnormal CT, MRI requested by radiologist. Patient not alert and oriented, used Fast Brain Protocol. TECHNIQUE: Magnetic resonance images of Christella brain without intravenous contrast in multiple planes. CONTRAST: Without. COMPARISON: Head CT 05/09/2024, CTA head and neck 05/08/2024, and CT head 05/08/2024. FINDINGS: BRAIN: Extensive acute posterior circulation infarcts in Genevive setting of vertebral artery occlusion. This includes confluent restricted diffusion throughout Kerigan right cerebellar PICA territory, moderate patchy restricted diffusion in Rosalynn contralateral left cerebellum PICA territory, associated right lateral medullary diffusion restricted infarction, and associated bifalamic infarcts with restricted diffusion. Early T2 and FLAIR hyperintense cytotoxic edema is present in Bronnie acutely affected areas. There is no hemorrhagic transformation or mass effect. No other diffusion restriction is identified. Underlying chronic cerebellar infarcts are more apparent on Chyrl left. Chronic microhemorrhage is present in Margarita right superior frontal gyrus.  Chronic encephalomalacia is noted in Dillon anterior left middle and superior frontal gyri, right greater than left occipital poles, left inferior occipital lobe, and posteromesial left temporal lobe, as seen by CT. Superimposed chronic lacunar infarcts are present in Amelia left basal ganglia. Underlying chronic ischemic disease involves Donte left MCA and bilateral PCA territories. No intracranial mass. No midline shift or extra-axial fluid collection. No cerebellar tonsillar ectopia. Grossly preserved major vascular flow voids; see CTA findings yesterday. VENTRICLES: No hydrocephalus. ORBITS: Daneen orbits are normal. SINUSES AND MASTOIDS:  Mixtli sinuses and mastoid air cells are clear. BONES: No acute fracture or focal osseous lesion. IMPRESSION: 1. Extensive acute posterior circulation infarcts in Shonice setting of vertebral artery occlusion: right >left PICA territory involvement, right lateral medullary, and bifalamic involvement. 2. Cytotoxic edema without hemorrhagic transformation or mass effect. 3. Underlying chronic ischemic disease in Threasa cerebellum, left MCA, and bilateral PCA territories. Electronically signed by: Helayne Hurst MD 05/09/2024 07:33 AM EDT RP Workstation: [id number]   CT HEAD WO CONTRAST ( ) Result Date: 05/09/2024 EXAM: CT HEAD WITHOUT CONTRAST 05/09/2024 04:37:23 AM TECHNIQUE: CT of Francia head was performed without Glendola administration of intravenous contrast. Automated exposure control, iterative reconstruction, and/or weight based adjustment of Giavana mA/kV was utilized to reduce Tinia radiation dose to as low as reasonably achievable. COMPARISON: Head CT and CTA head and neck 05/08/2024. Head CT 10/03/2021. CLINICAL HISTORY: 78 year old female with neuro deficit, acute, stroke suspected. Change in mental status since previous CT angio head. 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 Evidence of cerebellar infarcts on head CT 05/08/2024. FINDINGS: BRAIN AND VENTRICLES: Overall cerebral volume stable from 2023  head CT with unchanged chronic encephalomalacia in Archana bilateral PCA territories and anterior left middle frontal gyrus since that time. Multifocal deep perianal lacunar infarcts are stable from 05/08/2024. Patchy and confluent right greater than left inferior cerebellar infarcts redemonstrated and stable, right PICA territory involvement actually make that right PICA territory most affected. Circumscribed and chronic round 7 mm left mid cerebellar infarct (coronal image 54) which was present in 2023. No intracranial mass effect or ventriculomegaly. Some residual intravascular contrast. No acute intracranial hemorrhage identified. ORBITS: No acute abnormality. SINUSES: No acute abnormality. SOFT TISSUES AND SKULL: No acute soft tissue abnormality. No skull fracture. Advanced calcified atherosclerosis at Azaela skull base. IMPRESSION: 1. Unchanged noncontrast CT appearance of Valla brain since yesterday. 2. Patchy and confluent right greater than left inferior cerebellar infarcts, no hemorrhagic transformation or mass effect 3. Underlying advanced chronic ischemic disease. Electronically signed by: Helayne Hurst MD 05/09/2024 04:46 AM EDT RP Workstation: [id number]   CT ANGIO HEAD NECK W WO CM Result Date: 05/09/2024 EXAM: CTA HEAD AND NECK WITHOUT AND WITH IV CONTRAST 05/08/2024 11:55:00 PM TECHNIQUE: CTA of Marrietta head and neck was performed without and with Tynika administration of 75mL iohexol (OMNIPAQUE) 350 MG/ML injection 75 mL IOHEXOL 350 MG/ML SOLN intravenous contrast. Multiplanar 2D and/or 3D reformatted images are provided for review. Automated exposure control, iterative reconstruction, and/or weight based adjustment of Imogean mA/kV was utilized to reduce Saniyyah radiation dose to as low as reasonably achievable. Stenosis of Ceil internal carotid arteries measured using NASCET criteria. COMPARISON: Same day CT head CLINICAL HISTORY: Transient ischemic attack (TIA). FINDINGS: AORTIC ARCH AND ARCH VESSELS: Melody origins are  patent. Aortic atherosclerosis. CERVICAL CAROTID ARTERIES: No dissection, arterial injury, or hemodynamically significant stenosis by NASCET criteria. CERVICAL VERTEBRAL ARTERIES: Occluded Left vertebral artery origin with nonopacification throughout Arianni neck. Severe right v2 vertebral artery stenosis. SABRA LUNGS AND MEDIASTINUM: Unremarkable. SOFT TISSUES: No acute abnormality. BONES: No acute abnormality. ANTERIOR CIRCULATION: Severe left and moderate right intracranial atherosclerosis. No significant stenosis of Tekla anterior cerebral arteries. Severe left M2 MCA stenosis. Severely stenotic vs occluded mid right M2 MCA with distal reconstitution. No aneurysm. POSTERIOR CIRCULATION: Severe atherosclerosis of bilateral vertebral arteries with occlusion of Mikahla distal right vertebral artery and nonopacification of Johari left vertebral artery. Reconstitution of Norleen basilar artery, which is severely diseased by atherosclerosis as well with severe stenosis and small caliber. Terricka  PCAs are small proximally with bilateral p2 PCA occlusion. OTHER: No dural venous sinus thrombosis on this non-dedicated study. IMPRESSION: 1. 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 2. 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 3. Bilateral P2 PCA occlusion. 4. Small and severely stenotic basilar artery. 5. Severely stenotic vs occluded mid right M2 MCA with distal reconstitution. 6. Severe left and moderate right intracranial ICA stenosis. 7. Severe left M2 MCA stenosis. 8. Severe left A2 ACA stenosis. 9. Right upper lobe nodular opacities, most likely infectious/inflammatory. Non-contrast chest CT at 3-6 months is recommended. If Tyesha nodules are stable at time of repeat CT, then future CT at 18-24 months (from today's scan) is considered optional for low-risk patients, but is recommended for high-risk patients. This recommendation follows Natalia  consensus statement: Guidelines for Management of Incidental Pulmonary Nodules Detected on CT Images: From Dannielle Fleischner Society 2017; Radiology 2017; [id number]. Findings discussed with PA Carlo Cornish via telephone at 1:08 AM Electronically signed by: Gilmore Molt MD 05/09/2024 01:10 AM EDT RP Workstation: [id number]   CT Head Wo Contrast Addendum Date: 05/08/2024 ADDENDUM REPORT: 05/08/2024 19:20 ADDENDUM: These results were called by telephone at Ameli time of interpretation on 05/08/2024 at 7:20 pm to provider RILEY RANSOM , who verbally acknowledged these results. Electronically Signed   By: Morgane  Naveau M.D.   On: 05/08/2024 19:20   Result Date: 05/08/2024 CLINICAL DATA:  Headache, new onset (Age >= 51y) EXAM: CT HEAD WITHOUT CONTRAST TECHNIQUE: Contiguous axial images were obtained from Kenyatta base of Saanvi skull through Kameran vertex without intravenous contrast. RADIATION DOSE REDUCTION: This exam was performed according to Jemeka departmental dose-optimization program which includes automated exposure control, adjustment of Maisee mA and/or kV according to patient size and/or use of iterative reconstruction technique. COMPARISON:  CT head 10/03/2021 FINDINGS: Brain: Interval development of loss of gray-white matter differentiation with associated vasogenic edema of Janina bilateral cerebellar hemispheres, right greater than left. Underlying chronic bilateral cerebellar infarctions. Left frontal and right occipital encephalomalacia. Patchy and confluent areas of decreased attenuation are noted throughout Felicidad deep and periventricular white matter of Cassy cerebral hemispheres bilaterally, compatible with chronic microvascular ischemic disease. No parenchymal hemorrhage. No mass lesion. No extra-axial collection. No mass effect or midline shift. No hydrocephalus. Basilar cisterns are patent. Vascular: No hyperdense vessel. Atherosclerotic calcifications are present within Adelita cavernous internal carotid and vertebral  arteries. Skull: No acute fracture or focal lesion. Sinuses/Orbits: Paranasal sinuses and mastoid air cells are clear. Haasini orbits are unremarkable. Other: None. IMPRESSION: 1. 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 Recommend MRI without contrast for further evaluation. 2. No acute intracranial hemorrhage. Electronically Signed: By: Morgane  Naveau M.D. On: 05/08/2024 19:17   CT ABDOMEN PELVIS W CONTRAST Result Date: 05/08/2024 CLINICAL DATA:  LLQ abdominal pain EXAM: CT ABDOMEN AND PELVIS WITH CONTRAST TECHNIQUE: Multidetector CT imaging of Ruby abdomen and pelvis was performed using Kelcee standard protocol following bolus administration of intravenous contrast. RADIATION DOSE REDUCTION: This exam was performed according to Tai departmental dose-optimization program which includes automated exposure control, adjustment of Keeshia mA and/or kV according to patient size and/or use of iterative reconstruction technique. CONTRAST:  100mL OMNIPAQUE IOHEXOL 300 MG/ML  SOLN COMPARISON:  None Available. FINDINGS: Lower chest: Coronary artery calcification. Mitral annular calcification. Aortic valve leaflet calcification. Atherosclerotic plaque. Hepatobiliary: Talli hepatic parenchyma is diffusely hypodense compared to Frayda splenic parenchyma consistent with fatty infiltration. No  focal liver abnormality. No gallstones, gallbladder wall thickening, or pericholecystic fluid. No biliary dilatation. Pancreas: No focal lesion. Normal pancreatic contour. No surrounding inflammatory changes. No main pancreatic ductal dilatation. Spleen: Normal in size without focal abnormality.  Splenule noted Adrenals/Urinary Tract: No adrenal nodule bilaterally. Bilateral kidneys enhance symmetrically. No hydronephrosis. No hydroureter. Avaline urinary bladder is distended with urine. On delayed imaging, there is no urothelial wall thickening and there are no filling defects in Nadiyah opacified portions of  Evalette bilateral collecting systems or ureters. Stomach/Bowel: Stomach is within normal limits. No evidence of bowel wall thickening or dilatation. Colonic diverticulosis. Appendix appears normal. Vascular/Lymphatic: No abdominal aorta or iliac aneurysm. Severe atherosclerotic plaque of Shondrea aorta and its branches. No abdominal, pelvic, or inguinal lymphadenopathy. Reproductive: Uterus and bilateral adnexa are unremarkable. Other: No intraperitoneal free fluid. No intraperitoneal free gas. No organized fluid collection. Musculoskeletal: No abdominal wall hernia or abnormality. Diffusely decreased bone density. No suspicious lytic or blastic osseous lesions. No acute displaced fracture. Multilevel degenerative changes of Raymona spine. IMPRESSION: 1. No acute intra-abdominal or intrapelvic abnormality. 2. Hepatic steatosis. 3. Colonic diverticulosis. 4. Aortic Atherosclerosis (ICD10-I70.0) including mitral annular, coronary artery, aortic valve leaflet calcifications-correlate for aortic stenosis. Electronically Signed   By: Morgane  Naveau M.D.   On: 05/08/2024 19:12   DG Chest 2 View Result Date: 05/08/2024 CLINICAL DATA:  Hypertension. Hyperglycemia. Unwell feeling over Anayah past few days. Dizziness and weakness. Vomiting. EXAM: CHEST - 2 VIEW COMPARISON:  11/05/2023 FINDINGS: Postoperative changes in Aleeta mediastinum. Heart size and pulmonary vascularity are normal for technique. Lungs are clear. No pleural effusion or pneumothorax. Mediastinal contours appear intact. Calcification of Benita aorta. Degenerative changes in Janeece spine. IMPRESSION: No active cardiopulmonary disease. Electronically Signed   By: Elsie Gravely M.D.   On: 05/08/2024 16:51     PHYSICAL EXAM  Temp:  [97.6 F (36.4 C)-98.2 F (36.8 C)] 97.9 F (36.6 C) (10/11 1200) Pulse Rate:  [62-81] 74 (10/11 1700) Resp:  [11-16] 16 (10/11 1700) BP: (150-210)/(51-99) 150/51 (10/11 1700) SpO2:  [96 %-99 %] 98 % (10/11 1700) Weight:  [55.5 kg] 55.5  kg (10/10 2048)  General - Well nourished, well developed, in no apparent distress.  Ophthalmologic - fundi not visualized due to noncooperation.  Cardiovascular - Regular rhythm and rate.  Neuro - awake, alert, eyes open, orientated to place, but not to age or time. No aphasia but very limited language output, following most simple commands. Able to name 2/3 but not cooperative with repeating. No gaze palsy, tracking bilaterally, visual field full. No facial droop. Tongue midline. Bilateral UEs and bilaterally LEs symmetrical strength against gravity. Sensation not cooperative, b/l FTN intact but slow, no obvious ataxia, gait not tested.     ASSESSMENT/PLAN Ms. Virdia Caldwell is a 78 y.o. female with history of HTN, HLD, DM, CAD s/p CABG in 2022 admitted for nausea vomiting, headache, altered mental status, right facial droop, and found to be in DKA. No TNK given due to outside window.    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  Severe left M2 and A2 stenosis. 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 Ongoing aggressive stroke risk factor management Therapy recommendations:  CIR Disposition: Pending  Diabetes Found to be in DKA on presentation HgbA1c 13.2 goal < 7.0 Controlled Currently on lantus  CBG monitoring SSI DM education and close PCP follow up  Hypertension Unstable on Peggye  high end Gradually normalize BP in 2-3 days Long term BP goal normotensive  Hyperlipidemia Home meds:  none  LDL 189, goal < 70 Now on lipitor 80 Continue statin at discharge  PAD Mostafa doppler showed b/l arterial occlusion is noted in Jailee distal femoral artery. Monophasic flow is noted in Daune popliteal, posterior tibial, peroneal, and anterior tibial  arteries.  Asymptomatic at this time On DAPT now  Other Stroke Risk Factors Advanced age Coronary artery disease s/p CABG 2022  Other Active Problems Leukocytosis WBC 12.4--16.4  Hospital day # 1  Neurology will sign off. Please call with questions. Pt will follow up with stroke clinic NP at [facility] in about 4 weeks. Thanks for Bria consult.   Ary Cummins, MD PhD Stroke Neurology 05/10/2024 7:47 PM    To contact Stroke Continuity provider, please refer to [email or web address]. After hours, contact General Neurology

## 2024-05-11 DIAGNOSIS — I6502 Occlusion and stenosis of left vertebral artery: Secondary | ICD-10-CM | POA: Diagnosis not present

## 2024-05-11 DIAGNOSIS — E1110 Type 2 diabetes mellitus with ketoacidosis without coma: Secondary | ICD-10-CM | POA: Diagnosis not present

## 2024-05-11 DIAGNOSIS — R112 Nausea with vomiting, unspecified: Secondary | ICD-10-CM | POA: Diagnosis not present

## 2024-05-11 DIAGNOSIS — N3000 Acute cystitis without hematuria: Secondary | ICD-10-CM | POA: Diagnosis not present

## 2024-05-11 LAB — GLUCOSE, CAPILLARY
Glucose-Capillary: 152 mg/dL — ABNORMAL HIGH (ref 70–99)
Glucose-Capillary: 172 mg/dL — ABNORMAL HIGH (ref 70–99)
Glucose-Capillary: 240 mg/dL — ABNORMAL HIGH (ref 70–99)
Glucose-Capillary: 267 mg/dL — ABNORMAL HIGH (ref 70–99)
Glucose-Capillary: 272 mg/dL — ABNORMAL HIGH (ref 70–99)
Glucose-Capillary: 316 mg/dL — ABNORMAL HIGH (ref 70–99)
Glucose-Capillary: 359 mg/dL — ABNORMAL HIGH (ref 70–99)

## 2024-05-11 LAB — CBC
HCT: 44.1 % (ref 36.0–46.0)
Hemoglobin: 15.8 g/dL — ABNORMAL HIGH (ref 12.0–15.0)
MCH: 29.5 pg (ref 26.0–34.0)
MCHC: 35.8 g/dL (ref 30.0–36.0)
MCV: 82.3 fL (ref 80.0–100.0)
Platelets: 250 K/uL (ref 150–400)
RBC: 5.36 MIL/uL — ABNORMAL HIGH (ref 3.87–5.11)
RDW: 12.1 % (ref 11.5–15.5)
WBC: 12 K/uL — ABNORMAL HIGH (ref 4.0–10.5)
nRBC: 0 % (ref 0.0–0.2)

## 2024-05-11 LAB — BASIC METABOLIC PANEL WITH GFR
Anion gap: 10 (ref 5–15)
BUN: 22 mg/dL (ref 8–23)
CO2: 21 mmol/L — ABNORMAL LOW (ref 22–32)
Calcium: 8.7 mg/dL — ABNORMAL LOW (ref 8.9–10.3)
Chloride: 98 mmol/L (ref 98–111)
Creatinine, Ser: 0.62 mg/dL (ref 0.44–1.00)
GFR, Estimated: >60 mL/min (ref >60)
Glucose, Bld: 166 mg/dL — ABNORMAL HIGH (ref 70–99)
Potassium: 3 mmol/L — ABNORMAL LOW (ref 3.5–5.1)
Sodium: 129 mmol/L — ABNORMAL LOW (ref 135–145)

## 2024-05-11 LAB — MAGNESIUM: Magnesium: 1.6 mg/dL — ABNORMAL LOW (ref 1.7–2.4)

## 2024-05-11 MED ORDER — POTASSIUM CHLORIDE IN NACL 40-0.9 MEQ/L-% IV SOLN
INTRAVENOUS | Status: DC
  Filled 2024-05-11 (×2): qty 1000

## 2024-05-11 MED ORDER — AMLODIPINE BESYLATE 5 MG PO TABS
5.0000 mg | ORAL_TABLET | Freq: Every day | ORAL | Status: DC
  Administered 2024-05-11 – 2024-05-12 (×2): 5 mg via ORAL
  Filled 2024-05-11 (×2): qty 1

## 2024-05-11 MED ORDER — POTASSIUM CHLORIDE 20 MEQ PO PACK
40.0000 meq | PACK | ORAL | Status: DC
Start: 2024-05-11 — End: 2024-05-11

## 2024-05-11 MED ORDER — POLYVINYL ALCOHOL 1.4 % OP SOLN
1.0000 [drp] | OPHTHALMIC | Status: DC | PRN
  Filled 2024-05-11: qty 15

## 2024-05-11 MED ORDER — MAGNESIUM SULFATE 4 GM/100ML IV SOLN
4.0000 g | Freq: Once | INTRAVENOUS | Status: AC
  Administered 2024-05-11: 4 g via INTRAVENOUS
  Filled 2024-05-11: qty 100

## 2024-05-11 NOTE — Plan of Care (Signed)
   Problem: Clinical Measurements: Goal: Will remain free from infection Outcome: Progressing Goal: Diagnostic test results will improve Outcome: Progressing Goal: Respiratory complications will improve Outcome: Progressing Goal: Cardiovascular complication will be avoided Outcome: Progressing

## 2024-05-11 NOTE — Evaluation (Signed)
 Occupational Therapy Evaluation Patient Details Name: Milly Goggins MRN: [id number] DOB: 21-Feb-1946 Today's Date: 05/11/2024   History of Present Illness   Patient is a 78 y.o.  female who was admitted to [facility] with nausea/vomiting/lethargy-acute urinary retention-she was found to be in DKA-and acute ischemic CVA. Past medical history of CAD s/p CABG, HTN, HLD, DM-2, hypothyroidism.     Clinical Impressions Pt resting in bed, granddaughter present during session, Pt lethargic. Pt lives at home with family, PLOF mod I with RW. Pt currently with significant deficits, max-total for all ADLs and mobility, strong R lateral lean with R side neglect limits ability to sit EOB without constant assist. Pt able to move RUE/RLE with fair strength but poor coordination limits functional use. Pt R wrist IV started bleeding with bed mobility, RN informed. Granddaughter assisted with translating but Pt with reduced clarity of speech due to not having dentures in. Pt would benefit from postacute intensive rehab >3hrs/day to maximize functional strength and participation with ADLs, will continue to see acutely to progress as able.      If plan is discharge home, recommend Odis following:   Two people to help with walking and/or transfers;Two people to help with bathing/dressing/bathroom;Assistance with cooking/housework;Assistance with feeding;Direct supervision/assist for medications management;Assist for transportation;Help with stairs or ramp for entrance     Functional Status Assessment   Patient has had a recent decline in their functional status and demonstrates Shrita ability to make significant improvements in function in a reasonable and predictable amount of time.     Equipment Recommendations   Other (comment) (defer)     Recommendations for Other Services   Rehab consult     Precautions/Restrictions   Precautions Precautions: Fall Recall of Precautions/Restrictions:  Impaired Restrictions Weight Bearing Restrictions Per Provider Order: No     Mobility Bed Mobility Overal bed mobility: Needs Assistance Bed Mobility: Supine to Sit, Sit to Supine     Supine to sit: Total assist, +2 for physical assistance Sit to supine: Total assist, +2 for physical assistance   General bed mobility comments: total A x2, R lateral lean, R side neglect    Transfers                   General transfer comment: total, will need hoyer      Balance Overall balance assessment: Needs assistance Sitting-balance support: Bilateral upper extremity supported, Feet supported Sitting balance-[name] Scale: Poor Sitting balance - Comments: strong R lateral lean Postural control: Right lateral lean                                 ADL either performed or assessed with clinical judgement   ADL Overall ADL's : Needs assistance/impaired     Grooming: Maximal assistance;Bed level   Upper Body Bathing: Total assistance;Bed level   Lower Body Bathing: Total assistance;Bed level   Upper Body Dressing : Total assistance;Bed level   Lower Body Dressing: Total assistance;Bed level       Toileting- Clothing Manipulation and Hygiene: Total assistance;Bed level         General ADL Comments: Pt bed level, R side neglect with R lateral lean, poor RUE coordination, generalized weakness in LUE. Pt not able to sit EOB without total A for support.     Vision         Perception         Praxis  Pertinent Vitals/Pain Pain Assessment Pain Assessment: No/denies pain     Extremity/Trunk Assessment Upper Extremity Assessment Upper Extremity Assessment: RUE deficits/detail;LUE deficits/detail RUE Deficits / Details: R side neglect, difficult to assess, poor coordination, IV was bleeding at wrist. Fair ROM but sluggish movement RUE Sensation: decreased proprioception RUE Coordination: decreased fine motor;decreased gross motor LUE Deficits  / Details: generalized weakness, decreased FM skills, Pt lethargic and non-english speaking so difficult to assess           Communication Communication Communication: Impaired Factors Affecting Communication: Reduced clarity of speech;Other (comment) (non-english speaking)   Cognition Arousal: Lethargic Behavior During Therapy: Flat affect Cognition: Difficult to assess Difficult to assess due to: Non-English speaking           OT - Cognition Comments: Difficult to assess as Pt's granddaughter was translating and Pt did not have teeth in so difficulty speaking. Able to follow commands fairly accurately with L side, but due to R side neglect has R lateral lean and unaware of deficits.                 Following commands: Impaired Following commands impaired: Follows one step commands inconsistently, Follows one step commands with increased time     Cueing  General Comments   Cueing Techniques: Verbal cues;Tactile cues  Pt IV at R wrist bleeding during session, RN informed.   Exercises     Shoulder Instructions      Home Living Family/patient expects to be discharged to:: Private residence Living Arrangements: Children Available Help at Discharge: Family;Personal care attendant Type of Home: House Home Access: Stairs to enter [employer] of Steps: 1 Entrance Stairs-Rails: None Home Layout: One level     Bathroom Shower/Tub: Chief Strategy Officer: Standard     Home Equipment: Agricultural consultant (2 wheels);Cane - single point;Hand held shower head;Grab bars - toilet;Grab bars - tub/shower   Additional Comments: Pt lives with children, not available 24/7      Prior Functioning/Environment Prior Level of Function : Independent/Modified Independent             Mobility Comments: Mod I furniture walking/RW outside of [place] house ADLs Comments: Ind    OT Problem List: Decreased strength;Decreased range of motion;Decreased activity  tolerance;Impaired balance (sitting and/or standing);Impaired vision/perception;Decreased coordination;Decreased cognition;Decreased safety awareness;Impaired UE functional use   OT Treatment/Interventions: Self-care/ADL training;Therapeutic exercise;Neuromuscular education;Energy conservation;DME and/or AE instruction;Therapeutic activities;Patient/family education;Balance training      OT Goals(Current goals can be found in Gricel care plan section)   Acute Rehab OT Goals Patient Stated Goal: not able to participate in setting goals OT Goal Formulation: With family Time For Goal Achievement: 05/25/24 Potential to Achieve Goals: Fair   OT Frequency:  Min 2X/week    Co-evaluation              AM-PAC OT 6 Clicks Daily Activity     Outcome Measure Help from another person eating meals?: A Lot Help from another person taking care of personal grooming?: A Lot Help from another person toileting, which includes using toliet, bedpan, or urinal?: Total Help from another person bathing (including washing, rinsing, drying)?: Total Help from another person to put on and taking off regular upper body clothing?: Total Help from another person to put on and taking off regular lower body clothing?: Total 6 Click Score: 8   End of Session Nurse Communication: Mobility status;Other (comment) (R IV bleed)  Activity Tolerance: Patient limited by lethargy Patient left: in bed;with call  bell/phone within reach;with bed alarm set;with nursing/sitter in room;with family/visitor present  OT Visit Diagnosis: Unsteadiness on feet (R26.81);Other abnormalities of gait and mobility (R26.89);Muscle weakness (generalized) (M62.81);Other symptoms and signs involving cognitive function;Hemiplegia and hemiparesis Hemiplegia - Right/Left: Right                Time: 8493-8464 OT Time Calculation (min): 29 min Charges:  OT General Charges $OT Visit: 1 Visit OT Evaluation $OT Eval Moderate Complexity: 1  Mod OT Treatments $Self Care/Home Management : 8-22 mins  [place], OTR/L   JAELA YEPEZ 05/11/2024, 3:44 PM

## 2024-05-11 NOTE — Progress Notes (Signed)
 PROGRESS NOTE        PATIENT DETAILS Name: April Sheppard Age: 78 y.o. Sex: female Date of Birth: 08/21/1945 Admit Date: 05/08/2024 Admitting Physician Editha Ram, MD ERE:[id number], Ritchie BROCKS, MD (Inactive)  Brief Summary: Patient is a 78 y.o.  female with history of CAD s/p CABG, HTN, HLD, DM-2, hypothyroidism-who was admitted to [facility] with nausea/vomiting/lethargy-acute urinary retention-she was found to be in DKA-and acute ischemic CVA.  Significant events: 10/9>> admit to [facility] 10/10>> transferred to [facility]  Significant studies: 10/9>> A1c: 13.2 10/10>> CTA head/neck: Left vertebral artery origin occlusion, severe right V2 vertebral artery stenosis, bilateral P2 PCA occlusion, severe left M2 MCA stenosis, left A2 ACA stenosis. 10/10>> MRI brain: Extensive acute posterior circulation infarct in [place] setting of vertebral artery occlusion-right> left PICA territory involvement.+ve cytotoxic edema. 10/10>> echo: EF 60-65%, grade 1 diastolic dysfunction.  Moderate aortic valve regurgitation. 10/11>> LDL: 189.  Significant microbiology data: 10/9>> blood culture: No growth. 10/9>> urine culture: Pending.  Procedures: None  Consults: Neurology.  Subjective: No major issues overnight-per granddaughter at bedside-she has been tolerating dysphagia 1 diet.  Objective: Vitals: Blood pressure (!) 160/61, pulse 65, temperature 97.6 F (36.4 C), temperature source Axillary, resp. rate 11, weight 55.5 kg, SpO2 98%.   Exam: Gen Exam:Alert awake-not in any distress HEENT:atraumatic, normocephalic Chest: B/L clear to auscultation anteriorly CVS:S1S2 regular Abdomen:soft non tender, non distended Extremities:no edema Neurology: Moves all 4 extremities symmetrically-but does have generalized weakness. Skin: no rash  Pertinent Labs/Radiology:    Latest Ref Rng & Units 05/11/2024    4:58 AM 05/10/2024    8:35 AM 05/09/2024    5:37 AM  CBC  WBC 4.0 - 10.5 K/uL  12.0  16.3  16.4   Hemoglobin 12.0 - 15.0 g/dL 84.1  83.7  85.9   Hematocrit 36.0 - 46.0 % 44.1  45.9  40.1   Platelets 150 - 400 K/uL 250  242  235     Lab Results  Component Value Date   NA 129 (L) 05/11/2024   K 3.0 (L) 05/11/2024   CL 98 05/11/2024   CO2 21 (L) 05/11/2024      Assessment/Plan: DKA Known history of DM-2-poor compliance with medications per prior notes. Resolved with IV insulin/IV fluid Now on SQ insulin  Acute B/L PICA territory (right> left) ischemic infarcts with cytotoxic edema Etiology felt to be secondary to severe intracranial atherosclerotic disease. Difficult exam as sleepy/somnolent but/speaking in slow fluent speech-moving all 4 extremities Continue aspirin/Plavix x 3 months-then aspirin alone. Continue Lipitor 80 mg on discharge.  Hypertensive urgency Initially permissive hypertension was allowed-now that patient is almost 3 days out from presentation-Will start low-dose amlodipine . Gradually normalize blood pressure in Lanny next several days.  HLD Lipitor.  DM-2 with uncontrolled hyperglycemia CBGs currently stable Continue Lantus 5 units daily+ SSI As diet advances-Will need adjustment of insulin regimen.  Recent Labs    05/11/24 0038 05/11/24 0339 05/11/24 0810  GLUCAP 272* 172* 152*     Hypothyroidism TSH mildly elevated-Synthroid has been increased to 100 mcg Repeat TSH in 4-6 weeks.  CAD s/p CABG No anginal symptoms On antiplatelet/statin  Acute urinary retention Continue indwelling Foley catheter Voiding trial when bed more awake and mobile.  Moderate aortic valve regurgitation Seen on echocardiogram Stable for outpatient follow-up with cardiology.  Hypokalemia/hypomagnesemia Replete/recheck.  Hyponatremia Mild Stable for monitoring for now-she  is asymptomatic.  Right upper lobe nodular opacities Incidental finding on CT imaging. Radiology recommending repeat CT chest in 3 to 6 months  Class I  obesity: Estimated body mass index is 30.04 kg/m as calculated from [place] following:   Height as of 05/17/11: 5' 4 (1.626 m).   Weight as of 05/17/11: 79.4 kg.   Code status:   Code Status: Full Code   DVT Prophylaxis: enoxaparin (LOVENOX) injection 40 mg Start: 05/10/24 1030 Place and maintain sequential compression device Start: 05/09/24 1511  Family Communication: Granddaughter at bedside   Disposition Plan: Status is: Inpatient Remains inpatient appropriate because:    Planned Discharge Destination:Skilled nursing facility   Diet: Diet Order             DIET - DYS 1 Room service appropriate? No; Fluid consistency: Thin  Diet effective now                     Antimicrobial agents: Anti-infectives (From admission, onward)    Start     Dose/Rate Route Frequency Ordered Stop   05/08/24 2100  cefTRIAXone (ROCEPHIN) 1 g in sodium chloride 0.9 % 100 mL IVPB        1 g 200 mL/hr over 30 Minutes Intravenous  Once 05/08/24 2054 05/08/24 2311   05/08/24 2100  cefTRIAXone (ROCEPHIN) 1 g in sodium chloride 0.9 % 100 mL IVPB  Status:  Discontinued        1 g 200 mL/hr over 30 Minutes Intravenous Every 24 hours 05/08/24 2244 05/09/24 1743        MEDICATIONS: Scheduled Meds:  amLODipine   5 mg Oral Daily   aspirin EC  81 mg Oral Daily   atorvastatin  80 mg Oral Daily   Chlorhexidine Gluconate Cloth  6 each Topical Daily   clopidogrel  75 mg Oral Daily   enoxaparin (LOVENOX) injection  40 mg Subcutaneous Q24H   insulin aspart  0-5 Units Subcutaneous QHS   insulin aspart  0-9 Units Subcutaneous Q4H   insulin glargine  5 Units Subcutaneous Q24H   levothyroxine  100 mcg Oral Q0600   Continuous Infusions: PRN Meds:.acetaminophen **OR** acetaminophen, dextrose, hydrALAZINE   I have personally reviewed following labs and imaging studies  LABORATORY DATA: CBC: Recent Labs  Lab 05/08/24 1458 05/09/24 0537 05/10/24 0835 05/11/24 0458  WBC 12.4* 16.4* 16.3* 12.0*   NEUTROABS 10.4*  --   --   --   HGB 16.8* 14.0 16.2* 15.8*  HCT 50.6* 40.1 45.9 44.1  MCV 87.1 85.1 84.2 82.3  PLT 275 235 242 250    Basic Metabolic Panel: Recent Labs  Lab 05/09/24 0050 05/09/24 0537 05/09/24 0815 05/10/24 0835 05/11/24 0458  NA 138 141 135 132* 129*  K 3.0* 2.5* 3.4* 3.1* 3.0*  CL 105 113* 100 98 98  CO2 22 18* 22 20* 21*  GLUCOSE 219* 101* 167* 120* 166*  BUN 20 14 18 13 22   CREATININE 0.54 0.37* 0.53 0.46 0.62  CALCIUM 8.5* 6.7* 9.0 8.6* 8.7*  MG  --   --   --   --  1.6*    GFR: CrCl cannot be calculated (Unknown ideal weight.).  Liver Function Tests: Recent Labs  Lab 05/08/24 1458 05/09/24 0537 05/10/24 0835  AST 22 <10* 27  ALT 19 5 16   ALKPHOS 257* 67 165*  BILITOT 1.5* 0.3 1.5*  PROT 7.7 <3.0* 5.9*  ALBUMIN 4.6 <1.5* 3.1*   Recent Labs  Lab 05/08/24 1516  LIPASE 25  No results for input(s): AMMONIA in [place] last 168 hours.  Coagulation Profile: No results for input(s): INR, PROTIME in Nelda last 168 hours.  Cardiac Enzymes: No results for input(s): CKTOTAL, CKMB, CKMBINDEX, TROPONINI in [place] last 168 hours.  BNP (last 3 results) No results for input(s): PROBNP in Adelis last 8760 hours.  Lipid Profile: Recent Labs    05/10/24 0452  CHOL 266*  HDL 61  LDLCALC 189*  TRIG 78  CHOLHDL 4.4    Thyroid Function Tests: Recent Labs    05/09/24 0537  TSH 8.070*    Anemia Panel: No results for input(s): VITAMINB12, FOLATE, FERRITIN, TIBC, IRON, RETICCTPCT in [place] last 72 hours.  Urine analysis:    Component Value Date/Time   COLORURINE YELLOW 05/08/2024 1924   APPEARANCEUR CLEAR 05/08/2024 1924   LABSPEC 1.022 05/08/2024 1924   PHURINE 5.0 05/08/2024 1924   GLUCOSEU >=500 (A) 05/08/2024 1924   HGBUR NEGATIVE 05/08/2024 1924   BILIRUBINUR NEGATIVE 05/08/2024 1924   KETONESUR 80 (A) 05/08/2024 1924   PROTEINUR 100 (A) 05/08/2024 1924   UROBILINOGEN 0.2 06/09/2010 1539   NITRITE POSITIVE (A)  05/08/2024 1924   LEUKOCYTESUR SMALL (A) 05/08/2024 1924    Sepsis Labs: Lactic Acid, Venous    Component Value Date/Time   LATICACIDVEN 2.1 (HH) 05/08/2024 2308    MICROBIOLOGY: Recent Results (from Jane past 240 hours)  Urine Culture     Status: None (Preliminary result)   Collection Time: 05/08/24  7:24 PM   Specimen: Urine, Catheterized  Result Value Ref Range Status   Specimen Description   Final    URINE, CATHETERIZED Performed at [facility], [address] [place], [place] [address]    Special Requests   Final    NONE Performed at [facility], [address] [place], [place] [address]    Culture   Final    CULTURE REINCUBATED FOR BETTER GROWTH Performed at [facility], [address] [place], [place] [address]    Report Status PENDING  Incomplete  Culture, blood (Routine X 2) w Reflex to ID Panel     Status: None (Preliminary result)   Collection Time: 05/08/24 10:49 PM   Specimen: BLOOD  Result Value Ref Range Status   Specimen Description   Final    BLOOD RIGHT ANTECUBITAL Performed at [facility], [address] [place], [place] [address]    Special Requests   Final    Blood Culture results may not be optimal due to an inadequate volume of blood received in culture bottles BOTTLES DRAWN AEROBIC AND ANAEROBIC Performed at [facility], [address], [place], [place] [address]    Culture   Final    NO GROWTH 2 DAYS Performed at [facility], [address], [place], [place] [address]    Report Status PENDING  Incomplete    RADIOLOGY STUDIES/RESULTS: VAS US  LOWER EXTREMITY VENOUS (DVT) Result Date: 05/10/2024  Lower Venous DVT Study Patient Name:  Cait Haig      Date of Exam:   05/10/2024 Medical Rec #: [id number]   Accession #:    [id number] Date of Birth: 10-Jun-1946  Patient Gender: F Patient Age:   69 years Exam Location:  [facility] Procedure:      VAS  US  LOWER EXTREMITY VENOUS (DVT) Referring Phys: ARY XU --------------------------------------------------------------------------------  Indications: Stroke.  Risk Factors: None identified. Limitations: Poor ultrasound/tissue interface and patient positioning. Comparison Study: No prior studies.  Performing Technologist: Cordella Collet RVT  Examination Guidelines: A complete evaluation includes B-mode imaging, spectral Doppler, color Doppler, and power Doppler as needed of all accessible portions of each vessel. Bilateral testing is considered an integral part of a complete examination. Limited examinations for reoccurring indications may be performed as noted. Nykayla reflux portion of Bronte exam is performed with Jazmaine patient in reverse Trendelenburg.  +---------+---------------+---------+-----------+----------+--------------+ RIGHT    CompressibilityPhasicitySpontaneityPropertiesThrombus Aging +---------+---------------+---------+-----------+----------+--------------+ CFV      Full           Yes      Yes                                 +---------+---------------+---------+-----------+----------+--------------+ SFJ      Full                                                        +---------+---------------+---------+-----------+----------+--------------+ FV Prox  Full                                                        +---------+---------------+---------+-----------+----------+--------------+ FV Mid   Full                                                        +---------+---------------+---------+-----------+----------+--------------+ FV DistalFull                                                        +---------+---------------+---------+-----------+----------+--------------+ PFV      Full                                                        +---------+---------------+---------+-----------+----------+--------------+ POP      Full           Yes      Yes                                  +---------+---------------+---------+-----------+----------+--------------+ PTV      Full                                                        +---------+---------------+---------+-----------+----------+--------------+ PERO     Full                                                        +---------+---------------+---------+-----------+----------+--------------+  Arterial occlusion is noted in Clydia distal femoral artery. Monophasic flow is noted in Berniece popliteal, posterior tibial, peroneal, and anterior tibial arteries.  +---------+---------------+---------+-----------+----------+--------------+ LEFT     CompressibilityPhasicitySpontaneityPropertiesThrombus Aging +---------+---------------+---------+-----------+----------+--------------+ CFV      Full           Yes      Yes                                 +---------+---------------+---------+-----------+----------+--------------+ SFJ      Full                                                        +---------+---------------+---------+-----------+----------+--------------+ FV Prox  Full                                                        +---------+---------------+---------+-----------+----------+--------------+ FV Mid   Full                                                        +---------+---------------+---------+-----------+----------+--------------+ FV DistalFull                                                        +---------+---------------+---------+-----------+----------+--------------+ PFV      Full                                                        +---------+---------------+---------+-----------+----------+--------------+ POP      Full           Yes      Yes                                 +---------+---------------+---------+-----------+----------+--------------+ PTV      Full                                                         +---------+---------------+---------+-----------+----------+--------------+ PERO     Full                                                        +---------+---------------+---------+-----------+----------+--------------+ Arterial occlusion is noted in Lavonya mid femoral artery. Monophaisc flow is noted in Litzy distal femoral, popliteal, posterior tibial, peroneal, and  anterior tibial arteries.    Summary: RIGHT: - There is no evidence of deep vein thrombosis in Jahnaya lower extremity.  - No cystic structure found in Kamarii popliteal fossa. - Arterial occlusion is noted in Arielle distal femoral artery. Monophasic flow is noted in Jazmene popliteal, posterior tibial, peroneal, and anterior tibial arteries.  LEFT: - There is no evidence of deep vein thrombosis in Eldena lower extremity.  - No cystic structure found in Munira popliteal fossa. - Arterial occlusion is noted in Calandria mid femoral artery. Monophaisc flow is noted in Zharia distal femoral, popliteal, posterior tibial, peroneal, and anterior tibial arteries.  *See table(s) above for measurements and observations. Electronically signed by Gaile New MD on 05/10/2024 at 10:12:13 PM.    Final    ECHOCARDIOGRAM COMPLETE Result Date: 05/09/2024    ECHOCARDIOGRAM REPORT   Patient Name:   Charita Levels     Date of Exam: 05/09/2024 Medical Rec #:  [id number]  Height:       64.0 in Accession #:    [id number] Weight:       175.0 lb Date of Birth:  September 24, 1945 BSA:          1.848 m Patient Age:    77 years   BP:           171/63 mmHg Patient Gender: F          HR:           65 bpm. Exam Location:  Inpatient Procedure: 2D Echo, 3D Echo, Cardiac Doppler, Color Doppler and Strain Analysis            (Both Spectral and Color Flow Doppler were utilized during            procedure). Indications:    I35.0 Nonrheumatic aortic (valve) stenosis  History:        Patient has no prior history of Echocardiogram examinations. CAD                 and Previous Myocardial Infarction, Prior CABG, Stroke; Risk                  Factors:Hypertension, Dyslipidemia and Diabetes.  Sonographer:    Ellouise Mose RDCS Referring Phys: [id number] VASUNDHRA RATHORE IMPRESSIONS  1. Left ventricular ejection fraction, by estimation, is 60 to 65%. Mekaela left ventricle has normal function. Andee left ventricle demonstrates regional wall motion abnormalities (see scoring diagram/findings for description). There is severe concentric left ventricular hypertrophy. Left ventricular diastolic parameters are consistent with Grade I diastolic dysfunction (impaired relaxation). There is mild hypokinesis of Regena left ventricular, basal-mid anterolateral wall. Lillionna average left ventricular global longitudinal strain is -15.5 %. Milka global longitudinal strain is abnormal.  2. Right ventricular systolic function is normal. Kaedence right ventricular size is normal.  3. Left atrial size was mildly dilated.  4. Paloma mitral valve is degenerative. Trivial mitral valve regurgitation. No evidence of mitral stenosis. Severe mitral annular calcification.  5. Tiombe aortic valve is tricuspid. There is moderate calcification of Jelicia aortic valve. There is moderate thickening of Jaisa aortic valve. Aortic valve regurgitation is moderate. Aortic valve sclerosis/calcification is present, without any evidence of aortic stenosis.  6. Aalia inferior vena cava is normal in size with greater than 50% respiratory variability, suggesting right atrial pressure of 3 mmHg. Comparison(s): No prior Echocardiogram. FINDINGS  Left Ventricle: Left ventricular ejection fraction, by estimation, is 60 to 65%. Kailah left ventricle has normal function. Regnia left ventricle demonstrates regional wall motion abnormalities.  Mild hypokinesis of Marica left ventricular, basal-mid anterolateral wall. Annelise average left ventricular global longitudinal strain is -15.5 %. Strain was performed and Nayelli global longitudinal strain is abnormal. 3D ejection fraction reviewed and evaluated as part of Iyani interpretation. Alternate  measurement of EF is felt to be most reflective of LV function. Shereta left ventricular internal cavity size was normal in size. There is severe concentric left ventricular hypertrophy. Left ventricular diastolic parameters are consistent with Grade I diastolic dysfunction (impaired relaxation). Right Ventricle: Sion right ventricular size is normal. No increase in right ventricular wall thickness. Right ventricular systolic function is normal. Left Atrium: Left atrial size was mildly dilated. Right Atrium: Right atrial size was normal in size. Pericardium: There is no evidence of pericardial effusion. Mitral Valve: Mobile calcifications present on chordae. Vinia mitral valve is degenerative in appearance. Severe mitral annular calcification. Trivial mitral valve regurgitation. No evidence of mitral valve stenosis. Tricuspid Valve: Krystie tricuspid valve is normal in structure. Tricuspid valve regurgitation is trivial. No evidence of tricuspid stenosis. Aortic Valve: Sakia aortic valve is tricuspid. There is moderate calcification of [name] aortic valve. There is moderate thickening of Lakechia aortic valve. Aortic valve regurgitation is moderate. Aortic regurgitation PHT measures 419 msec. Aortic valve sclerosis/calcification is present, without any evidence of aortic stenosis. Aortic valve mean gradient measures 6.8 mmHg. Aortic valve peak gradient measures 12.8 mmHg. Aortic valve area, by VTI measures 2.38 cm. Pulmonic Valve: Danyla pulmonic valve was not well visualized. Pulmonic valve regurgitation is not visualized. No evidence of pulmonic stenosis. Aorta: Keyanna aortic root, ascending aorta, aortic arch and descending aorta are all structurally normal, with no evidence of dilitation or obstruction. Venous: Lamisha left upper pulmonary vein is normal. Leianna inferior vena cava is normal in size with greater than 50% respiratory variability, suggesting right atrial pressure of 3 mmHg. IAS/Shunts: No atrial level shunt detected by color flow  Doppler. Additional Comments: 3D was performed not requiring image post processing on an independent workstation and was indeterminate.  LEFT VENTRICLE PLAX 2D LVIDd:         4.20 cm     Diastology LVIDs:         2.70 cm     LV e' medial:    3.15 cm/s LV PW:         1.83 cm     LV E/e' medial:  24.9 LV IVS:        1.73 cm     LV e' lateral:   3.26 cm/s LVOT diam:     2.20 cm     LV E/e' lateral: 24.1 LV SV:         84 LV SV Index:   45          2D Longitudinal Strain LVOT Area:     3.80 cm    2D Strain GLS (A4C):   -17.5 % LV IVRT:       85 msec     2D Strain GLS (A3C):   -12.6 %                            2D Strain GLS (A2C):   -16.3 %                            2D Strain GLS Avg:     -15.5 % LV Volumes (MOD) LV vol d, MOD A2C: 77.5 ml LV vol  d, MOD A4C: 59.5 ml LV vol s, MOD A2C: 31.7 ml LV vol s, MOD A4C: 25.0 ml LV SV MOD A2C:     45.8 ml LV SV MOD A4C:     59.5 ml LV SV MOD BP:      40.7 ml RIGHT VENTRICLE             IVC RV S prime:     10.80 cm/s  IVC diam: 1.70 cm TAPSE (M-mode): 1.8 cm LEFT ATRIUM           Index        RIGHT ATRIUM           Index LA diam:      3.70 cm 2.00 cm/m   RA Area:     11.80 cm LA Vol (A2C): 35.5 ml 19.21 ml/m  RA Volume:   23.00 ml  12.44 ml/m LA Vol (A4C): 38.4 ml 20.75 ml/m  AORTIC VALVE AV Area (Vmax):    2.36 cm AV Area (Vmean):   2.34 cm AV Area (VTI):     2.38 cm AV Vmax:           179.00 cm/s AV Vmean:          116.250 cm/s AV VTI:            0.353 m AV Peak Grad:      12.8 mmHg AV Mean Grad:      6.8 mmHg LVOT Vmax:         111.00 cm/s LVOT Vmean:        71.700 cm/s LVOT VTI:          0.221 m LVOT/AV VTI ratio: 0.63 AI PHT:            419 msec  AORTA Ao Root diam: 2.90 cm Ao Asc diam:  3.50 cm MITRAL VALVE MV Area (PHT): 2.76 cm     SHUNTS MV Decel Time: 275 msec     Systemic VTI:  0.22 m MV E velocity: 78.50 cm/s   Systemic Diam: 2.20 cm MV A velocity: 114.00 cm/s MV E/A ratio:  0.69 Shelda Bruckner MD Electronically signed by Shelda Bruckner MD  Signature Date/Time: 05/09/2024/8:08:03 PM    Final      LOS: 2 days   Donalda Applebaum, MD  [facility]    To contact Rumor attending provider between 7A-7P or Donae covering provider during after hours 7P-7A, please log into [place] web site [email or web address] and access using universal [place] password for that web site. If you do not have [place]vi password, please call [place] hospital operator.  05/11/2024, 9:50 AM

## 2024-05-12 DIAGNOSIS — E1110 Type 2 diabetes mellitus with ketoacidosis without coma: Secondary | ICD-10-CM | POA: Diagnosis not present

## 2024-05-12 DIAGNOSIS — N3000 Acute cystitis without hematuria: Secondary | ICD-10-CM | POA: Diagnosis not present

## 2024-05-12 DIAGNOSIS — I6502 Occlusion and stenosis of left vertebral artery: Secondary | ICD-10-CM | POA: Diagnosis not present

## 2024-05-12 DIAGNOSIS — R112 Nausea with vomiting, unspecified: Secondary | ICD-10-CM | POA: Diagnosis not present

## 2024-05-12 LAB — PHOSPHORUS: Phosphorus: 3.1 mg/dL (ref 2.5–4.6)

## 2024-05-12 LAB — URINE CULTURE: Culture: >=100000 — AB

## 2024-05-12 LAB — BASIC METABOLIC PANEL WITH GFR
Anion gap: 11 (ref 5–15)
BUN: 28 mg/dL — ABNORMAL HIGH (ref 8–23)
CO2: 20 mmol/L — ABNORMAL LOW (ref 22–32)
Calcium: 8.4 mg/dL — ABNORMAL LOW (ref 8.9–10.3)
Chloride: 102 mmol/L (ref 98–111)
Creatinine, Ser: 0.68 mg/dL (ref 0.44–1.00)
GFR, Estimated: >60 mL/min (ref >60)
Glucose, Bld: 111 mg/dL — ABNORMAL HIGH (ref 70–99)
Potassium: 3.1 mmol/L — ABNORMAL LOW (ref 3.5–5.1)
Sodium: 133 mmol/L — ABNORMAL LOW (ref 135–145)

## 2024-05-12 LAB — GLUCOSE, CAPILLARY
Glucose-Capillary: 102 mg/dL — ABNORMAL HIGH (ref 70–99)
Glucose-Capillary: 131 mg/dL — ABNORMAL HIGH (ref 70–99)
Glucose-Capillary: 138 mg/dL — ABNORMAL HIGH (ref 70–99)
Glucose-Capillary: 183 mg/dL — ABNORMAL HIGH (ref 70–99)
Glucose-Capillary: 222 mg/dL — ABNORMAL HIGH (ref 70–99)
Glucose-Capillary: 240 mg/dL — ABNORMAL HIGH (ref 70–99)
Glucose-Capillary: 94 mg/dL (ref 70–99)

## 2024-05-12 LAB — MAGNESIUM: Magnesium: 2.2 mg/dL (ref 1.7–2.4)

## 2024-05-12 MED ORDER — STROKE: EARLY STAGES OF RECOVERY BOOK
Freq: Once | Status: AC
  Administered 2024-05-12: 1
  Filled 2024-05-12: qty 1

## 2024-05-12 MED ORDER — ENSURE PLUS HIGH PROTEIN PO LIQD
237.0000 mL | Freq: Two times a day (BID) | ORAL | Status: DC
  Administered 2024-05-13: 237 mL via ORAL

## 2024-05-12 MED ORDER — CEFADROXIL 500 MG PO CAPS
1000.0000 mg | ORAL_CAPSULE | Freq: Two times a day (BID) | ORAL | Status: DC
  Administered 2024-05-12 – 2024-05-13 (×3): 1000 mg via ORAL
  Filled 2024-05-12 (×4): qty 2

## 2024-05-12 MED ORDER — POTASSIUM CHLORIDE CRYS ER 20 MEQ PO TBCR
40.0000 meq | EXTENDED_RELEASE_TABLET | Freq: Four times a day (QID) | ORAL | Status: AC
  Administered 2024-05-12 (×2): 40 meq via ORAL
  Filled 2024-05-12 (×2): qty 2

## 2024-05-12 MED ORDER — NYSTATIN 100000 UNIT/ML MT SUSP
5.0000 mL | Freq: Four times a day (QID) | OROMUCOSAL | Status: DC
  Administered 2024-05-12 – 2024-05-13 (×4): 500000 [IU] via ORAL
  Filled 2024-05-12 (×4): qty 5

## 2024-05-12 MED ORDER — QUETIAPINE FUMARATE 25 MG PO TABS
25.0000 mg | ORAL_TABLET | Freq: Once | ORAL | Status: AC
Start: 2024-05-12 — End: 2024-05-12
  Administered 2024-05-12: 25 mg via ORAL
  Filled 2024-05-12: qty 1

## 2024-05-12 MED ORDER — AMLODIPINE BESYLATE 10 MG PO TABS
10.0000 mg | ORAL_TABLET | Freq: Every day | ORAL | Status: DC
  Administered 2024-05-13: 10 mg via ORAL
  Filled 2024-05-12: qty 1

## 2024-05-12 NOTE — [place] FL2 (Signed)
 [place]  MEDICAID FL2 LEVEL OF CARE FORM     IDENTIFICATION  Patient Name: April Sheppard Birthdate: 1945/10/02 Sex: female Admission Date (Current Location): 05/08/2024  [facility] and [place] Number:  Producer, television/film/video and Address:  Alila Kremlin. [facility] [place], [address], [place], [place] [address]      Provider Number: [id number]  Attending Physician Name and Address:  Raenelle Donalda HERO, MD  Relative Name and Phone Number:  [name]; Granddaughter; [phone] and Phebe Miyamoto; Granddaughter; [phone]    Current Level of Care: Hospital Recommended Level of Care: Skilled Nursing Facility Prior Approval Number:    Date Approved/Denied:   PASRR Number: [id number]  Discharge Plan: SNF    Current Diagnoses: Patient Active Problem List   Diagnosis Date Noted   DKA (diabetic ketoacidosis) (HCC) 05/08/2024   CVA (cerebral vascular accident) (HCC) 05/08/2024   Hypertensive urgency 05/08/2024   Acute urinary retention 05/08/2024   CAD (coronary artery disease) 05/08/2024    Orientation RESPIRATION BLADDER Height & Weight     Self, Place  Normal (Room Air) Continent, External catheter Weight: 122 lb 5.7 oz (55.5 kg) Height:     BEHAVIORAL SYMPTOMS/MOOD NEUROLOGICAL BOWEL NUTRITION STATUS      Continent Diet (Please see discharge summary)  AMBULATORY STATUS COMMUNICATION OF NEEDS Skin   Extensive Assist Verbally Normal                       Personal Care Assistance Level of Assistance  Bathing, Feeding, Dressing Bathing Assistance: Maximum assistance Feeding assistance: Maximum assistance Dressing Assistance: Maximum assistance     Functional Limitations Info  Hearing   Hearing Info: Impaired (R and L)      SPECIAL CARE FACTORS FREQUENCY  PT (By licensed PT), OT (By licensed OT)     PT Frequency: 5x OT Frequency: 5x            Contractures Contractures Info: Not present    Additional Factors Info  Code Status, Allergies,  Insulin Sliding Scale Code Status Info: Full Code Allergies Info: Lisinopril   Insulin Sliding Scale Info: Please see discharge summary       Current Medications (05/12/2024):  This is [facility] current hospital active medication list Current Facility-Administered Medications  Medication Dose Route Frequency Provider Last Rate Last Admin    stroke: early stages of recovery book   Does not apply Once Sethi, Pramod S, MD       acetaminophen (TYLENOL) tablet 650 mg  650 mg Oral Q6H PRN Alfornia Madison, MD       Or   acetaminophen (TYLENOL) suppository 650 mg  650 mg Rectal Q6H PRN Rathore, Vasundhra, MD       [START ON 05/13/2024] amLODipine  (NORVASC ) tablet 10 mg  10 mg Oral Daily Ghimire, Donalda HERO, MD       artificial tears ophthalmic solution 1 drop  1 drop Both Eyes PRN Ghimire, Shanker M, MD       aspirin EC tablet 81 mg  81 mg Oral Daily Jerri Pfeiffer, MD   81 mg at 05/12/24 0944   atorvastatin (LIPITOR) tablet 80 mg  80 mg Oral Daily Jerri Pfeiffer, MD   80 mg at 05/12/24 0944   cefadroxil (DURICEF) capsule 1,000 mg  1,000 mg Oral BID Raenelle Donalda HERO, MD   1,000 mg at 05/12/24 1158   Chlorhexidine Gluconate Cloth 2 % PADS 6 each  6 each Topical Daily Ghimire, Donalda HERO, MD   6 each  at 05/12/24 0946   clopidogrel (PLAVIX) tablet 75 mg  75 mg Oral Daily Barbarann Nest, MD   75 mg at 05/12/24 0944   dextrose 50 % solution 0-50 mL  0-50 mL Intravenous PRN Bernis Ernst, PA-C       enoxaparin (LOVENOX) injection 40 mg  40 mg Subcutaneous Q24H Raenelle Donalda HERO, MD   40 mg at 05/12/24 0945   hydrALAZINE (APRESOLINE) injection 5 mg  5 mg Intravenous Q4H PRN Barbarann Nest, MD   5 mg at 05/10/24 1612   insulin aspart (novoLOG) injection 0-5 Units  0-5 Units Subcutaneous QHS Barbarann Nest, MD   4 Units at 05/11/24 2218   insulin aspart (novoLOG) injection 0-9 Units  0-9 Units Subcutaneous Q4H Barbarann Nest, MD   1 Units at 05/12/24 1157   insulin glargine (LANTUS) injection 5 Units  5 Units  Subcutaneous Q24H Rathore, Vasundhra, MD   5 Units at 05/12/24 0944   levothyroxine (SYNTHROID) tablet 100 mcg  100 mcg Oral Q0600 Barbarann Nest, MD   100 mcg at 05/12/24 0537   nystatin (MYCOSTATIN) 100000 UNIT/ML suspension 500,000 Units  5 mL Oral QID Raenelle Donalda HERO, MD   500,000 Units at 05/12/24 1328     Discharge Medications: Please see discharge summary for a list of discharge medications.  Relevant Imaging Results:  Relevant Lab Results:   Additional Information SSN [id number]  Lauraine FORBES Saa, LCSWA

## 2024-05-12 NOTE — Progress Notes (Signed)
 PROGRESS NOTE        PATIENT DETAILS Name: April Sheppard Age: 78 y.o. Sex: female Date of Birth: [birth year] Admit Date: 05/08/2024 Admitting Physician Editha Ram, MD ERE:[id number], Ritchie BROCKS, MD (Inactive)  Brief Summary: Patient is a 78 y.o.  female with history of CAD s/p CABG, HTN, HLD, DM-2, hypothyroidism-who was admitted to [facility] with nausea/vomiting/lethargy-acute urinary retention-she was found to be in DKA-and acute ischemic CVA.  Significant events: 10/9>> admit to [facility] 10/10>> transferred to [facility]  Significant studies: 10/9>> A1c: 13.2 10/10>> CTA head/neck: Left vertebral artery origin occlusion, severe right V2 vertebral artery stenosis, bilateral P2 PCA occlusion, severe left M2 MCA stenosis, left A2 ACA stenosis. 10/10>> MRI brain: Extensive acute posterior circulation infarct in Xian setting of vertebral artery occlusion-right> left PICA territory involvement.+ve cytotoxic edema. 10/10>> echo: EF 60-65%, grade 1 diastolic dysfunction.  Moderate aortic valve regurgitation. 10/11>> LDL: 189.  Significant microbiology data: 10/9>> blood culture: No growth. 10/9>> urine culture: Klebsiella pneumoniae  Procedures: None  Consults: Neurology.  Subjective: No major issues-lying comfortably in bed-per granddaughter at bedside-tolerating dysphagia 1 diet well.  Objective: Vitals: Blood pressure (!) 153/76, pulse 61, temperature 98 F (36.7 C), temperature source Oral, resp. rate 11, weight 55.5 kg, SpO2 98%.   Exam: Gen Exam:Alert awake-not in any distress HEENT:atraumatic, normocephalic Chest: B/L clear to auscultation anteriorly CVS:S1S2 regular Abdomen:soft non tender, non distended Extremities:no edema Neurology: Moving all 4 extremities this morning. Skin: no rash  Pertinent Labs/Radiology:    Latest Ref Rng & Units 05/11/2024    4:58 AM 05/10/2024    8:35 AM 05/09/2024    5:37 AM  CBC  WBC 4.0 - 10.5 K/uL 12.0  16.3  16.4    Hemoglobin 12.0 - 15.0 g/dL 84.1  83.7  85.9   Hematocrit 36.0 - 46.0 % 44.1  45.9  40.1   Platelets 150 - 400 K/uL 250  242  235     Lab Results  Component Value Date   NA 133 (L) 05/12/2024   K 3.1 (L) 05/12/2024   CL 102 05/12/2024   CO2 20 (L) 05/12/2024      Assessment/Plan: DKA Known history of DM-2-poor compliance with medications per prior notes. Resolved with IV insulin/IV fluid Now on SQ insulin  Acute B/L PICA territory (right> left) ischemic infarcts with cytotoxic edema Etiology felt to be secondary to severe intracranial atherosclerotic disease. Difficult exam as sleepy/somnolent but/speaking in slow fluent speech-moving all 4 extremities Continue aspirin/Plavix x 3 months-then aspirin alone. Continue Lipitor 80 mg on discharge.  Hypertensive urgency Initially permissive hypertension was allowed Now that patient is several days out from acute CVA-Will increase amlodipine  to 10 mg daily Continue to follow and optimize.  HLD Lipitor.  DM-2 with uncontrolled hyperglycemia CBGs currently stable Continue Lantus 5 units daily+ SSI As diet advances-Will need adjustment of insulin regimen.  Recent Labs    05/11/24 2359 05/12/24 0401 05/12/24 0805  GLUCAP 240* 94 102*     Hypothyroidism TSH mildly elevated-Synthroid has been increased to 100 mcg Repeat TSH in 4-6 weeks.  CAD s/p CABG No anginal symptoms On antiplatelet/statin  Complicated UTI Has Foley catheter-urine culture positive for Klebsiella Oral cefadroxil x 3 days.  Acute urinary retention Will attempt a voiding trial today-discontinue Foley catheter  Moderate aortic valve regurgitation Seen on echocardiogram Stable for outpatient follow-up with cardiology.  Hypokalemia/hypomagnesemia  Replete/recheck.  Hyponatremia Mild Stable for monitoring for now-she is asymptomatic.  Right upper lobe nodular opacities Incidental finding on CT imaging. Radiology recommending repeat CT chest  in 3 to 6 months  Class I obesity: Estimated body mass index is 30.04 kg/m as calculated from [place] following:   Height as of 05/17/11: 5' 4 (1.626 m).   Weight as of 05/17/11: 79.4 kg.   Code status:   Code Status: Full Code   DVT Prophylaxis: enoxaparin (LOVENOX) injection 40 mg Start: 05/10/24 1030 Place and maintain sequential compression device Start: 05/09/24 1511  Family Communication: Granddaughter at bedside   Disposition Plan: Status is: Inpatient Remains inpatient appropriate because:    Planned Discharge Destination:Skilled nursing facility   Diet: Diet Order             DIET - DYS 1 Room service appropriate? No; Fluid consistency: Thin  Diet effective now                     Antimicrobial agents: Anti-infectives (From admission, onward)    Start     Dose/Rate Route Frequency Ordered Stop   05/08/24 2100  cefTRIAXone (ROCEPHIN) 1 g in sodium chloride 0.9 % 100 mL IVPB        1 g 200 mL/hr over 30 Minutes Intravenous  Once 05/08/24 2054 05/08/24 2311   05/08/24 2100  cefTRIAXone (ROCEPHIN) 1 g in sodium chloride 0.9 % 100 mL IVPB  Status:  Discontinued        1 g 200 mL/hr over 30 Minutes Intravenous Every 24 hours 05/08/24 2244 05/09/24 1743        MEDICATIONS: Scheduled Meds:  amLODipine   5 mg Oral Daily   aspirin EC  81 mg Oral Daily   atorvastatin  80 mg Oral Daily   Chlorhexidine Gluconate Cloth  6 each Topical Daily   clopidogrel  75 mg Oral Daily   enoxaparin (LOVENOX) injection  40 mg Subcutaneous Q24H   insulin aspart  0-5 Units Subcutaneous QHS   insulin aspart  0-9 Units Subcutaneous Q4H   insulin glargine  5 Units Subcutaneous Q24H   levothyroxine  100 mcg Oral Q0600   potassium chloride  40 mEq Oral Q6H   Continuous Infusions: PRN Meds:.acetaminophen **OR** acetaminophen, artificial tears, dextrose, hydrALAZINE   I have personally reviewed following labs and imaging studies  LABORATORY DATA: CBC: Recent Labs  Lab  05/08/24 1458 05/09/24 0537 05/10/24 0835 05/11/24 0458  WBC 12.4* 16.4* 16.3* 12.0*  NEUTROABS 10.4*  --   --   --   HGB 16.8* 14.0 16.2* 15.8*  HCT 50.6* 40.1 45.9 44.1  MCV 87.1 85.1 84.2 82.3  PLT 275 235 242 250    Basic Metabolic Panel: Recent Labs  Lab 05/09/24 0537 05/09/24 0815 05/10/24 0835 05/11/24 0458 05/12/24 0258  NA 141 135 132* 129* 133*  K 2.5* 3.4* 3.1* 3.0* 3.1*  CL 113* 100 98 98 102  CO2 18* 22 20* 21* 20*  GLUCOSE 101* 167* 120* 166* 111*  BUN 14 18 13 22  28*  CREATININE 0.37* 0.53 0.46 0.62 0.68  CALCIUM 6.7* 9.0 8.6* 8.7* 8.4*  MG  --   --   --  1.6* 2.2  PHOS  --   --   --   --  3.1    GFR: CrCl cannot be calculated (Unknown ideal weight.).  Liver Function Tests: Recent Labs  Lab 05/08/24 1458 05/09/24 0537 05/10/24 0835  AST 22 <10* 27  ALT 19  5 16  ALKPHOS 257* 67 165*  BILITOT 1.5* 0.3 1.5*  PROT 7.7 <3.0* 5.9*  ALBUMIN 4.6 <1.5* 3.1*   Recent Labs  Lab 05/08/24 1516  LIPASE 25   No results for input(s): AMMONIA in [place] last 168 hours.  Coagulation Profile: No results for input(s): INR, PROTIME in [place]isia last 168 hours.  Cardiac Enzymes: No results for input(s): CKTOTAL, CKMB, CKMBINDEX, TROPONINI in Ezrah last 168 hours.  BNP (last 3 results) No results for input(s): PROBNP in [place] last 8760 hours.  Lipid Profile: Recent Labs    05/10/24 0452  CHOL 266*  HDL 61  LDLCALC 189*  TRIG 78  CHOLHDL 4.4    Thyroid Function Tests: No results for input(s): TSH, T4TOTAL, FREET4, T3FREE, THYROIDAB in [place] last 72 hours.   Anemia Panel: No results for input(s): VITAMINB12, FOLATE, FERRITIN, TIBC, IRON, RETICCTPCT in Kyley last 72 hours.  Urine analysis:    Component Value Date/Time   COLORURINE YELLOW 05/08/2024 1924   APPEARANCEUR CLEAR 05/08/2024 1924   LABSPEC 1.022 05/08/2024 1924   PHURINE 5.0 05/08/2024 1924   GLUCOSEU >=500 (A) 05/08/2024 1924   HGBUR NEGATIVE 05/08/2024  1924   BILIRUBINUR NEGATIVE 05/08/2024 1924   KETONESUR 80 (A) 05/08/2024 1924   PROTEINUR 100 (A) 05/08/2024 1924   UROBILINOGEN 0.2 06/09/2010 1539   NITRITE POSITIVE (A) 05/08/2024 1924   LEUKOCYTESUR SMALL (A) 05/08/2024 1924    Sepsis Labs: Lactic Acid, Venous    Component Value Date/Time   LATICACIDVEN 2.1 (HH) 05/08/2024 2308    MICROBIOLOGY: Recent Results (from [place] past 240 hours)  Urine Culture     Status: Abnormal (Preliminary result)   Collection Time: 05/08/24  7:24 PM   Specimen: Urine, Catheterized  Result Value Ref Range Status   Specimen Description   Final    URINE, CATHETERIZED Performed at [facility], [address], [place], [place] [address]    Special Requests   Final    NONE Performed at [facility], [address], [place], [place] [address]    Culture (A)  Final    >=100,000 COLONIES/mL KLEBSIELLA PNEUMONIAE SUSCEPTIBILITIES TO FOLLOW Performed at [facility], [address] [place], [place] [address]    Report Status PENDING  Incomplete  Culture, blood (Routine X 2) w Reflex to ID Panel     Status: None (Preliminary result)   Collection Time: 05/08/24 10:49 PM   Specimen: BLOOD  Result Value Ref Range Status   Specimen Description   Final    BLOOD RIGHT ANTECUBITAL Performed at [facility], [address], [place], [place] [address]    Special Requests   Final    Blood Culture results may not be optimal due to an inadequate volume of blood received in culture bottles BOTTLES DRAWN AEROBIC AND ANAEROBIC Performed at [facility], [address] [place], [place] [address]    Culture   Final    NO GROWTH 3 DAYS Performed at [facility], [address], [place], [place] [address]    Report Status PENDING  Incomplete    RADIOLOGY STUDIES/RESULTS: VAS US  LOWER EXTREMITY VENOUS (DVT) Result Date: 05/10/2024  Lower Venous DVT Study Patient Name:   Lyza Viveros      Date of Exam:   05/10/2024 Medical Rec #: [id number]   Accession #:    [id number] Date of Birth: [birth year]  Patient Gender: F Patient Age:   34 years Exam Location:  [facility] Procedure:      VAS US  LOWER EXTREMITY VENOUS (DVT) Referring Phys: ARY XU --------------------------------------------------------------------------------  Indications: Stroke.  Risk Factors: None identified. Limitations: Poor ultrasound/tissue interface and patient positioning. Comparison Study: No prior studies. Performing Technologist: Cordella Collet RVT  Examination Guidelines: A complete evaluation includes B-mode imaging, spectral Doppler, color Doppler, and power Doppler as needed of all accessible portions of each vessel. Bilateral testing is considered an integral part of a complete examination. Limited examinations for reoccurring indications may be performed as noted. Haylei reflux portion of Sallyann exam is performed with Odena patient in reverse Trendelenburg.  +---------+---------------+---------+-----------+----------+--------------+ RIGHT    CompressibilityPhasicitySpontaneityPropertiesThrombus Aging +---------+---------------+---------+-----------+----------+--------------+ CFV      Full           Yes      Yes                                 +---------+---------------+---------+-----------+----------+--------------+ SFJ      Full                                                        +---------+---------------+---------+-----------+----------+--------------+ FV Prox  Full                                                        +---------+---------------+---------+-----------+----------+--------------+ FV Mid   Full                                                        +---------+---------------+---------+-----------+----------+--------------+ FV DistalFull                                                         +---------+---------------+---------+-----------+----------+--------------+ PFV      Full                                                        +---------+---------------+---------+-----------+----------+--------------+ POP      Full           Yes      Yes                                 +---------+---------------+---------+-----------+----------+--------------+ PTV      Full                                                        +---------+---------------+---------+-----------+----------+--------------+ PERO  Full                                                        +---------+---------------+---------+-----------+----------+--------------+ Arterial occlusion is noted in Dennise distal femoral artery. Monophasic flow is noted in Rakiyah popliteal, posterior tibial, peroneal, and anterior tibial arteries.  +---------+---------------+---------+-----------+----------+--------------+ LEFT     CompressibilityPhasicitySpontaneityPropertiesThrombus Aging +---------+---------------+---------+-----------+----------+--------------+ CFV      Full           Yes      Yes                                 +---------+---------------+---------+-----------+----------+--------------+ SFJ      Full                                                        +---------+---------------+---------+-----------+----------+--------------+ FV Prox  Full                                                        +---------+---------------+---------+-----------+----------+--------------+ FV Mid   Full                                                        +---------+---------------+---------+-----------+----------+--------------+ FV DistalFull                                                        +---------+---------------+---------+-----------+----------+--------------+ PFV      Full                                                         +---------+---------------+---------+-----------+----------+--------------+ POP      Full           Yes      Yes                                 +---------+---------------+---------+-----------+----------+--------------+ PTV      Full                                                        +---------+---------------+---------+-----------+----------+--------------+ PERO     Full                                                        +---------+---------------+---------+-----------+----------+--------------+  Arterial occlusion is noted in Aurilla mid femoral artery. Monophaisc flow is noted in Malcolm distal femoral, popliteal, posterior tibial, peroneal, and anterior tibial arteries.    Summary: RIGHT: - There is no evidence of deep vein thrombosis in Caylah lower extremity.  - No cystic structure found in Wen popliteal fossa. - Arterial occlusion is noted in Leani distal femoral artery. Monophasic flow is noted in Cherrill popliteal, posterior tibial, peroneal, and anterior tibial arteries.  LEFT: - There is no evidence of deep vein thrombosis in Cintya lower extremity.  - No cystic structure found in Mylasia popliteal fossa. - Arterial occlusion is noted in Mayra mid femoral artery. Monophaisc flow is noted in Kimari distal femoral, popliteal, posterior tibial, peroneal, and anterior tibial arteries.  *See table(s) above for measurements and observations. Electronically signed by Gaile New MD on 05/10/2024 at 10:12:13 PM.    Final      LOS: 3 days   Donalda Applebaum, MD  [facility]    To contact Delbra attending provider between 7A-7P or Tosca covering provider during after hours 7P-7A, please log into [place] web site [email or web address] and access using universal [place] password for that web site. If you do not have Is[place] password, please call Janell hospital operator.  05/12/2024, 9:43 AM

## 2024-05-12 NOTE — TOC Initial Note (Signed)
 Transition of Care [facility]) - Initial/Assessment Note    Patient Details  Name: April Sheppard MRN: [id number] Date of Birth: 08/02/1945  Transition of [facility] CM/SW Contact:    Lauraine FORBES Saa, LCSWA Phone Number: 05/12/2024, 4:02 PM  Clinical Narrative:                  4:02 PM CSW introduced self and role to patient's granddaughter, [name] (per chart review, patient is not fully oriented, and per MD, patient's granddaughters have been translating for patient). Patient appeared asleep during CSW conversation with [name]. CSW informed [name] that patient is not a candidate for CIR due to level of needs. Hong expressed understanding of Wonder information. CSW inquired about patient discharging to SNF and informed [name] of SNF insurance coverage. Hong was agreeable with patient discharging to SNF in [place]. CSW sent patient's FL2 to SNFs in [facility]. CSW will continue to follow.   Expected Discharge Plan: Skilled Nursing Facility Barriers to Discharge: Continued Medical Work up, SNF Pending bed offer   Patient Goals and CMS Choice            Expected Discharge Plan and Services In-house Referral: Clinical Social Work   Post Acute Care Choice: Skilled Nursing Facility Living arrangements for [name] past 2 months: Single Family Home                                      Prior Living Arrangements/Services Living arrangements for [name] past 2 months: Single Family Home Lives with:: Relatives Patient language and need for interpreter reviewed:: Yes        Need for Family Participation in Patient Care: Yes (Comment)     Criminal Activity/Legal Involvement Pertinent to Current Situation/Hospitalization: No - Comment as needed  Activities of Daily Living      Permission Sought/Granted Permission sought to share information with : Family Supports Permission granted to share information with : No (Contact information on chart)  Share Information with NAME: [name]      Permission granted to share info w Relationship: Granddaughter  Permission granted to share info w Contact Information: [phone]  Emotional Assessment Appearance:: Appears stated age Attitude/Demeanor/Rapport: Unable to Assess Affect (typically observed): Unable to Assess Orientation: : Oriented to Self, Oriented to Place Alcohol / Substance Use: Not Applicable Psych Involvement: No (comment)  Admission diagnosis:  Lethargy [R53.83] DKA (diabetic ketoacidosis) (HCC) [E11.10] Acute cystitis without hematuria [N30.00] Acute urinary retention [R33.8] Posterior circulation stroke (HCC) [I63.50] 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] Diabetic ketoacidosis without coma associated with type 2 diabetes mellitus (HCC) [E11.10] Nausea and vomiting, unspecified vomiting type [R11.2] Patient Active Problem List   Diagnosis Date Noted   DKA (diabetic ketoacidosis) (HCC) 05/08/2024   CVA (cerebral vascular accident) (HCC) 05/08/2024   Hypertensive urgency 05/08/2024   Acute urinary retention 05/08/2024   CAD (coronary artery disease) 05/08/2024   PCP:  [name], MD (Inactive) Pharmacy:   [facility] [phone] - [place],  - [address] AT [facility] [address] [place]  [address] Phone: [phone] Fax: [phone]  [facility] [place] [facility] [address], [address] [place] [address] Phone: [phone] Fax: [phone]  [facility] - [facility] [address] [place] [address] Phone: [phone] Fax: [phone]     Social Drivers  of Health (SDOH) Social History: SDOH Screenings   Food Insecurity: No Food Insecurity (05/09/2024)  Housing: Unknown (05/09/2024)  Transportation Needs: No Transportation Needs (05/09/2024)  Utilities: Not At Risk (05/09/2024)  Social Connections: Patient Unable To Answer (05/10/2024)  Tobacco Use: Unknown  (05/08/2024)   SDOH Interventions:     Readmission Risk Interventions     No data to display

## 2024-05-12 NOTE — Progress Notes (Signed)
 Inpatient Rehab Admissions Coordinator:   Per therapy request pt was rescreened for CIR.  Pt making slow but steady progress with therapy. Unfortunately we would not likely have a bed for her until later in [place] week and she is rapidly approaching being ready to d/c.  I've asked TOC to consider referral to other area AIRs as I do believe she would benefit.  If she is unable to discharge in [place] next few days please let us  know and we can rescreen.   Reche Lowers, PT, DPT Admissions Coordinator [phone] 05/12/24  10:52 PM

## 2024-05-12 NOTE — Plan of Care (Signed)

## 2024-05-12 NOTE — Care Management Important Message (Signed)
 Important Message  Patient Details  Name: April Sheppard MRN: [id number] Date of Birth: 06/22/1946   Important Message Given:  Yes - Medicare IM     [name] 05/12/2024, 3:57 PM

## 2024-05-12 NOTE — Progress Notes (Signed)
 Inpatient Rehab Admissions Coordinator:   Per therapy recommendations, patient was screened for CIR candidacy by Leita Kleine, MS, CCC-SLP. At this time, Pt. is total A and not demonstrating ability for CIR at this time. If ready for d/c will need SNF. Please contact me with any questions.   Leita Kleine, MS, CCC-SLP Rehab Admissions Coordinator  [phone] (celll) [phone] (office)

## 2024-05-12 NOTE — Progress Notes (Signed)
 Physical Therapy Treatment Patient Details Name: April Sheppard MRN: [id number] DOB: 07-29-1946 Today's Date: 05/12/2024   History of Present Illness Patient is a 78 y.o.  female who was admitted to [facility] with nausea/vomiting/lethargy-acute urinary retention-she was found to be in DKA-and acute ischemic CVA. Past medical history of CAD s/p CABG, HTN, HLD, DM-2, hypothyroidism.    PT Comments  Although lethargic/sleepy today pt demo'd improve attention to R side and ability to move R UE and [place]. Pt able to sit on EOB and maintain midline for short periods of time. With maxAX2 pt able to transfer OOB to chair this date. Pt much improved since initial eval despite sleepiness. Per RN and family pt did not sleep at all last night which is contributing to sleepiness today. Acute PT to cont to follow. Continue to recommend aggressive inpatient rehab program > 3 hrs a day to address above deficits and progress toward safe level of function for transition home with family.    If plan is discharge home, recommend [place] following: Two people to help with walking and/or transfers;A lot of help with bathing/dressing/bathroom;Assistance with cooking/housework;Direct supervision/assist for medications management;Direct supervision/assist for financial management;Assist for transportation;Help with stairs or ramp for entrance;Supervision due to cognitive status   Can travel by private vehicle        Equipment Recommendations  Wheelchair (measurements PT);Wheelchair cushion (measurements PT);Hospital bed    Recommendations for Other Services Rehab consult     Precautions / Restrictions Precautions Precautions: Fall Recall of Precautions/Restrictions: Impaired Restrictions Weight Bearing Restrictions Per Provider Order: No     Mobility  Bed Mobility Overal bed mobility: Needs Assistance Bed Mobility: Supine to Sit     Supine to sit: Mod assist, Used rails     General bed mobility comments: max directional  verbal and tactile cues, pt with active participation however impulsive    Transfers Overall transfer level: Needs assistance Equipment used: 2 person hand held assist (face to face transfer with gait belt) Transfers: Sit to/from Stand, Bed to chair/wheelchair/BSC Sit to Stand: Max assist, +2 physical assistance   Step pivot transfers: Max assist, +2 physical assistance       General transfer comment: pt with good initiation however with poor trunk control and difficulty sequencing steps to chair requiring maxAx2 to safely transfer    Ambulation/Gait               General Gait Details: limited to steps to chair   Stairs             Wheelchair Mobility     Tilt Bed    Modified Rankin (Stroke Patients Only)       Balance Overall balance assessment: Needs assistance Sitting-balance support: Bilateral upper extremity supported, Feet supported Sitting balance-[name] Scale: Poor Sitting balance - Comments: pt initially able to sit EOB with close contact guard however then progressed to needing maxA more so due to anterior LOB tendencies                                    Communication Communication Communication: Impaired Factors Affecting Communication: Reduced clarity of speech;Other (comment) (non-english speaking)  Cognition Arousal: Lethargic Behavior During Therapy: Flat affect   PT - Cognitive impairments: Difficult to assess Difficult to assess due to: Non-English speaking (granddaughters present to translate)  PT - Cognition Comments: Pt very lethargic and non english speaking. Following commands: Impaired Following commands impaired: Follows one step commands inconsistently, Follows one step commands with increased time    Cueing Cueing Techniques: Verbal cues, Tactile cues  Exercises Other Exercises Other Exercises: attempted to work on dynamic reaching however pt to sleepy post transfer to stay on  task    General Comments General comments (skin integrity, edema, etc.): BP elevated 186/72, RN aware and reports MD to have changed BP medication      Pertinent Vitals/Pain Pain Assessment Pain Assessment: No/denies pain Faces Pain Scale: No hurt Breathing: normal Negative Vocalization: none Facial Expression: smiling or inexpressive Body Language: relaxed Consolability: no need to console PAINAD Score: 0    Home Living                          Prior Function            PT Goals (current goals can now be found in [place] care plan section) Acute Rehab PT Goals PT Goal Formulation: Patient unable to participate in goal setting Time For Goal Achievement: 05/24/24 Potential to Achieve Goals: Fair Progress towards PT goals: Progressing toward goals    Frequency    Min 3X/week      PT Plan      Co-evaluation              AM-PAC PT 6 Clicks Mobility   Outcome Measure  Help needed turning from your back to your side while in a flat bed without using bedrails?: A Lot Help needed moving from lying on your back to sitting on [place] side of a flat bed without using bedrails?: A Lot Help needed moving to and from a bed to a chair (including a wheelchair)?: A Lot Help needed standing up from a chair using your arms (e.g., wheelchair or bedside chair)?: Total Help needed to walk in hospital room?: Total Help needed climbing 3-5 steps with a railing? : Total 6 Click Score: 9    End of Session Equipment Utilized During Treatment: Gait belt Activity Tolerance: Patient limited by lethargy Patient left: with call bell/phone within reach;in chair;with chair alarm set;with family/visitor present Nurse Communication: Mobility status (High BP) PT Visit Diagnosis: Other abnormalities of gait and mobility (R26.89)     Time: 8765-8746 PT Time Calculation (min) (ACUTE ONLY): 19 min  Charges:    $Neuromuscular Re-education: 8-22 mins PT General Charges $$ ACUTE  PT VISIT: 1 Visit                     Norene Ames, PT, DPT Acute Rehabilitation Services Secure chat preferred Office #: [phone]    Norene CHRISTELLA Ames 05/12/2024, 2:05 PM

## 2024-05-12 NOTE — Inpatient Diabetes Management (Signed)
 Inpatient Diabetes Program Recommendations  AACE/ADA: New Consensus Statement on Inpatient Glycemic Control (2015)  Target Ranges:  Prepandial:   less than 140 mg/dL      Peak postprandial:   less than 180 mg/dL (1-2 hours)      Critically ill patients:  140 - 180 mg/dL   Lab Results  Component Value Date   GLUCAP 102 (H) 05/12/2024   HGBA1C 13.2 (H) 05/08/2024    Latest Reference Range & Units 05/11/24 08:10 05/11/24 12:14 05/11/24 17:01 05/11/24 20:22 05/11/24 22:16 05/11/24 23:59 05/12/24 04:01 05/12/24 08:05  Glucose-Capillary 70 - 99 mg/dL 847 (H) 759 (H) 732 (H) 359 (H) 316 (H) 240 (H) 94 102 (H)  (H): Data is abnormally high  Diabetes history: DM2 Outpatient Diabetes medications: Metformin 1000 mg BID Current orders for Inpatient glycemic control: IV insulin-transitioning to Lantus 5 every day, Novolog 0-9 units TID and 0-5 units at bedtime  Inpatient Diabetes Program Recommendations:   Postprandial CBGs elevated. Please consider: -Add Novolog 2 units tid meal coverage if eats 50%  Thank you, April Mccrumb E. Bhavin Monjaraz, RN, MSN, CNS, CDCES  Diabetes Coordinator Inpatient Glycemic Control Team Team Pager [phone] (8am-5pm) 05/12/2024 8:35 AM

## 2024-05-12 NOTE — Plan of Care (Signed)
   Problem: Clinical Measurements: Goal: Ability to maintain clinical measurements within normal limits will improve Outcome: Progressing Goal: Will remain free from infection Outcome: Progressing Goal: Diagnostic test results will improve Outcome: Progressing Goal: Respiratory complications will improve Outcome: Progressing

## 2024-05-13 DIAGNOSIS — I6350 Cerebral infarction due to unspecified occlusion or stenosis of unspecified cerebral artery: Secondary | ICD-10-CM | POA: Diagnosis not present

## 2024-05-13 DIAGNOSIS — I63533 Cerebral infarction due to unspecified occlusion or stenosis of bilateral posterior cerebral arteries: Secondary | ICD-10-CM | POA: Diagnosis not present

## 2024-05-13 DIAGNOSIS — N3000 Acute cystitis without hematuria: Secondary | ICD-10-CM | POA: Diagnosis not present

## 2024-05-13 DIAGNOSIS — E1110 Type 2 diabetes mellitus with ketoacidosis without coma: Secondary | ICD-10-CM | POA: Diagnosis not present

## 2024-05-13 LAB — BASIC METABOLIC PANEL WITH GFR
Anion gap: 12 (ref 5–15)
BUN: 15 mg/dL (ref 8–23)
CO2: 19 mmol/L — ABNORMAL LOW (ref 22–32)
Calcium: 8.7 mg/dL — ABNORMAL LOW (ref 8.9–10.3)
Chloride: 103 mmol/L (ref 98–111)
Creatinine, Ser: 0.55 mg/dL (ref 0.44–1.00)
GFR, Estimated: >60 mL/min (ref >60)
Glucose, Bld: 83 mg/dL (ref 70–99)
Potassium: 4 mmol/L (ref 3.5–5.1)
Sodium: 134 mmol/L — ABNORMAL LOW (ref 135–145)

## 2024-05-13 LAB — GLUCOSE, CAPILLARY
Glucose-Capillary: 92 mg/dL (ref 70–99)
Glucose-Capillary: 118 mg/dL — ABNORMAL HIGH (ref 70–99)
Glucose-Capillary: 265 mg/dL — ABNORMAL HIGH (ref 70–99)
Glucose-Capillary: 199 mg/dL — ABNORMAL HIGH (ref 70–99)

## 2024-05-13 LAB — MAGNESIUM: Magnesium: 2.2 mg/dL (ref 1.7–2.4)

## 2024-05-13 MED ORDER — MELATONIN 10 MG PO TABS: 10.0000 mg | ORAL_TABLET | Freq: Every day | ORAL | Status: AC

## 2024-05-13 MED ORDER — INSULIN GLARGINE 100 UNIT/ML ~~LOC~~ SOLN: 5.0000 [IU] | SUBCUTANEOUS | Status: AC

## 2024-05-13 MED ORDER — NYSTATIN 100000 UNIT/ML MT SUSP: 5.0000 mL | Freq: Four times a day (QID) | OROMUCOSAL | Status: AC

## 2024-05-13 MED ORDER — NOVOLOG FLEXPEN 100 UNIT/ML ~~LOC~~ SOPN: PEN_INJECTOR | SUBCUTANEOUS | Status: AC

## 2024-05-13 MED ORDER — ENSURE PLUS HIGH PROTEIN PO LIQD: 237.0000 mL | Freq: Two times a day (BID) | ORAL | Status: AC

## 2024-05-13 MED ORDER — ATORVASTATIN CALCIUM 80 MG PO TABS: 80.0000 mg | ORAL_TABLET | Freq: Every day | ORAL | Status: AC

## 2024-05-13 MED ORDER — MELATONIN 5 MG PO TABS: 5.0000 mg | ORAL_TABLET | Freq: Every day | ORAL | Status: DC

## 2024-05-13 MED ORDER — LEVOTHYROXINE SODIUM 100 MCG PO TABS: 100.0000 ug | ORAL_TABLET | Freq: Every day | ORAL | Status: AC

## 2024-05-13 MED ORDER — CEFADROXIL 500 MG PO CAPS: 1000.0000 mg | ORAL_CAPSULE | Freq: Two times a day (BID) | ORAL | Status: AC

## 2024-05-13 MED ORDER — ASPIRIN 81 MG PO TBEC: 81.0000 mg | DELAYED_RELEASE_TABLET | Freq: Every day | ORAL | Status: AC

## 2024-05-13 MED ORDER — QUETIAPINE FUMARATE 25 MG PO TABS
25.0000 mg | ORAL_TABLET | Freq: Every day | ORAL | Status: DC
Start: 2024-05-13 — End: 2024-05-13

## 2024-05-13 MED ORDER — CARVEDILOL 12.5 MG PO TABS: 6.2500 mg | ORAL_TABLET | Freq: Two times a day (BID) | ORAL | Status: AC

## 2024-05-13 MED ORDER — QUETIAPINE FUMARATE 50 MG PO TABS: 50.0000 mg | ORAL_TABLET | Freq: Every day | ORAL | Status: AC

## 2024-05-13 MED ORDER — QUETIAPINE FUMARATE 50 MG PO TABS: 50.0000 mg | ORAL_TABLET | Freq: Every day | ORAL | Status: DC

## 2024-05-13 MED ORDER — MELATONIN 5 MG PO TABS: 10.0000 mg | ORAL_TABLET | Freq: Every day | ORAL | Status: DC

## 2024-05-13 MED ORDER — CLOPIDOGREL BISULFATE 75 MG PO TABS: 75.0000 mg | ORAL_TABLET | Freq: Every day | ORAL | Status: AC

## 2024-05-13 MED ORDER — TAMSULOSIN HCL 0.4 MG PO CAPS: 0.4000 mg | ORAL_CAPSULE | Freq: Every day | ORAL | Status: AC

## 2024-05-13 MED ORDER — CARVEDILOL 6.25 MG PO TABS
6.2500 mg | ORAL_TABLET | Freq: Two times a day (BID) | ORAL | Status: DC
  Administered 2024-05-13: 6.25 mg via ORAL
  Filled 2024-05-13: qty 1

## 2024-05-13 NOTE — TOC Progression Note (Addendum)
 Transition of Care [facility]) - Progression Note    Patient Details  Name: April Sheppard MRN: [id number] Date of Birth: [birth year]  Transition of Care [facility]) CM/SW Contact  Marval Gell, RN Phone Number: 05/13/2024, 2:06 PM  Clinical Narrative:     [place] w MD and family, agreeable to [facility] IR who has beds available, submitted through [facility] they are reviewing.   Patient accepted at [facility], spoke w attending, will DC later today.  Nurse instructed to call report to [phone].  Will arrange PTAR once DC placed.   14:56 PTAR called   Expected Discharge Plan: Skilled Nursing Facility Barriers to Discharge: Continued Medical Work up, SNF Pending bed offer               Expected Discharge Plan and Services In-house Referral: Clinical Social Work   Post Acute Care Choice: Skilled Nursing Facility Living arrangements for [place] past 2 months: Single Family Home                                       Social Drivers of Health (SDOH) Interventions SDOH Screenings   Food Insecurity: No Food Insecurity (05/09/2024)  Housing: Unknown (05/09/2024)  Transportation Needs: No Transportation Needs (05/09/2024)  Utilities: Not At Risk (05/09/2024)  Social Connections: Patient Unable To Answer (05/10/2024)  Tobacco Use: Unknown (05/08/2024)    Readmission Risk Interventions     No data to display

## 2024-05-13 NOTE — Progress Notes (Addendum)
 Report given to [place], Charity fundraiser at [employer].  PTAR here for pt pick up.  Pt transferred from chair to stretcher with this RN and PTAR staff.  [place] emptied prior to transport.  Pt's granddaughter has all of [place] pt's belongings.  Pt left unit without incident

## 2024-05-13 NOTE — Progress Notes (Signed)
 PROGRESS NOTE        PATIENT DETAILS Name: April Sheppard Age: 78 y.o. Sex: female Date of Birth: Jun 29, 1946 Admit Date: 05/08/2024 Admitting Physician Editha Ram, MD ERE:[id number], Ritchie BROCKS, MD (Inactive)  Brief Summary: Patient is a 78 y.o.  female with history of CAD s/p CABG, HTN, HLD, DM-2, hypothyroidism-who was admitted to [facility] with nausea/vomiting/lethargy-acute urinary retention-she was found to be in DKA-and acute ischemic CVA.  Significant events: 10/9>> admit to [facility] 10/10>> transferred to [facility] 10/13>> Foley removed-developed retention x 2 10/14>>Foley catheter reinserted  Significant studies: 10/9>> A1c: 13.2 10/10>> CTA head/neck: Left vertebral artery origin occlusion, severe right V2 vertebral artery stenosis, bilateral P2 PCA occlusion, severe left M2 MCA stenosis, left A2 ACA stenosis. 10/10>> MRI brain: Extensive acute posterior circulation infarct in [place] setting of vertebral artery occlusion-right> left PICA territory involvement.+ve cytotoxic edema. 10/10>> echo: EF 60-65%, grade 1 diastolic dysfunction.  Moderate aortic valve regurgitation. 10/11>> LDL: 189.  Significant microbiology data: 10/9>> blood culture: No growth. 10/9>> urine culture: Klebsiella pneumoniae  Procedures: None  Consults: Neurology.  Subjective: Some delirium last night.  Objective: Vitals: Blood pressure (!) 188/77, pulse 66, temperature 97.7 F (36.5 C), temperature source Oral, resp. rate 10, weight 55.5 kg, SpO2 95%.   Exam: Gen Exam:Alert awake-not in any distress HEENT:atraumatic, normocephalic Chest: B/L clear to auscultation anteriorly CVS:S1S2 regular Abdomen:soft non tender, non distended Extremities:no edema Neurology: Moving all 4 extremities. Skin: no rash  Pertinent Labs/Radiology:    Latest Ref Rng & Units 05/11/2024    4:58 AM 05/10/2024    8:35 AM 05/09/2024    5:37 AM  CBC  WBC 4.0 - 10.5 K/uL 12.0  16.3  16.4    Hemoglobin 12.0 - 15.0 g/dL 84.1  83.7  85.9   Hematocrit 36.0 - 46.0 % 44.1  45.9  40.1   Platelets 150 - 400 K/uL 250  242  235     Lab Results  Component Value Date   NA 134 (L) 05/13/2024   K 4.0 05/13/2024   CL 103 05/13/2024   CO2 19 (L) 05/13/2024      Assessment/Plan: DKA Known history of DM-2-poor compliance with medications per prior notes. Resolved with IV insulin/IV fluid Now on SQ insulin  Acute B/L PICA territory (right> left) ischemic infarcts with cytotoxic edema Etiology felt to be secondary to severe intracranial atherosclerotic disease. Difficult exam as sleepy/somnolent but/speaking in slow fluent speech-moving all 4 extremities Continue aspirin/Plavix x 3 months-then aspirin alone. Continue Lipitor 80 mg on discharge.  Hypertensive urgency Initially permissive hypertension was allowed Once patient was several days out from CVA-amlodipine  was restarted Blood pressure continues to be elevated-restart Coreg  at half of home dose.  Follow/optimize.  HLD Lipitor.  DM-2 with uncontrolled hyperglycemia CBGs currently stable Continue Lantus 5 units daily+ SSI As diet advances-Will need adjustment of insulin regimen.  Recent Labs    05/12/24 2348 05/13/24 0347 05/13/24 0822  GLUCAP 131* 92 118*     Hypothyroidism TSH mildly elevated-Synthroid has been increased to 100 mcg Repeat TSH in 4-6 weeks.  CAD s/p CABG No anginal symptoms On antiplatelet/statin  Complicated UTI Has Foley catheter-urine culture positive for Klebsiella Oral cefadroxil x 3 days.  Acute urinary retention Failed voiding trial 10/13-Foley catheter reinserted 10/14.  Moderate aortic valve regurgitation Seen on echocardiogram Stable for outpatient follow-up with cardiology.  Hypokalemia/hypomagnesemia Repleted.  Hyponatremia Mild Stable for monitoring for now-she is asymptomatic.  Hospital delirium Received Seroquel last night-but was still  agitated/restless Increase Seroquel to 50 mg nightly Add melatonin.  Right upper lobe nodular opacities Incidental finding on CT imaging. Radiology recommending repeat CT chest in 3 to 6 months  Class I obesity: Estimated body mass index is 30.04 kg/m as calculated from [place] following:   Height as of 05/17/11: 5' 4 (1.626 m).   Weight as of 05/17/11: 79.4 kg.   Code status:   Code Status: Full Code   DVT Prophylaxis: enoxaparin (LOVENOX) injection 40 mg Start: 05/10/24 1030 Place and maintain sequential compression device Start: 05/09/24 1511  Family Communication: Granddaughter at bedside   Disposition Plan: Status is: Inpatient Remains inpatient appropriate because:    Planned Discharge Destination:Skilled nursing facility   Diet: Diet Order             DIET - DYS 1 Room service appropriate? No; Fluid consistency: Thin  Diet effective now                     Antimicrobial agents: Anti-infectives (From admission, onward)    Start     Dose/Rate Route Frequency Ordered Stop   05/12/24 1045  cefadroxil (DURICEF) capsule 1,000 mg        1,000 mg Oral 2 times daily 05/12/24 0948 05/15/24 0959   05/08/24 2100  cefTRIAXone (ROCEPHIN) 1 g in sodium chloride 0.9 % 100 mL IVPB        1 g 200 mL/hr over 30 Minutes Intravenous  Once 05/08/24 2054 05/08/24 2311   05/08/24 2100  cefTRIAXone (ROCEPHIN) 1 g in sodium chloride 0.9 % 100 mL IVPB  Status:  Discontinued        1 g 200 mL/hr over 30 Minutes Intravenous Every 24 hours 05/08/24 2244 05/09/24 1743        MEDICATIONS: Scheduled Meds:  amLODipine   10 mg Oral Daily   aspirin EC  81 mg Oral Daily   atorvastatin  80 mg Oral Daily   cefadroxil  1,000 mg Oral BID   Chlorhexidine Gluconate Cloth  6 each Topical Daily   clopidogrel  75 mg Oral Daily   enoxaparin (LOVENOX) injection  40 mg Subcutaneous Q24H   feeding supplement  237 mL Oral BID BM   insulin aspart  0-5 Units Subcutaneous QHS   insulin aspart   0-9 Units Subcutaneous Q4H   insulin glargine  5 Units Subcutaneous Q24H   levothyroxine  100 mcg Oral Q0600   melatonin  5 mg Oral QHS   nystatin  5 mL Oral QID   QUEtiapine  25 mg Oral QHS   Continuous Infusions: PRN Meds:.acetaminophen **OR** acetaminophen, artificial tears, dextrose, hydrALAZINE   I have personally reviewed following labs and imaging studies  LABORATORY DATA: CBC: Recent Labs  Lab 05/08/24 1458 05/09/24 0537 05/10/24 0835 05/11/24 0458  WBC 12.4* 16.4* 16.3* 12.0*  NEUTROABS 10.4*  --   --   --   HGB 16.8* 14.0 16.2* 15.8*  HCT 50.6* 40.1 45.9 44.1  MCV 87.1 85.1 84.2 82.3  PLT 275 235 242 250    Basic Metabolic Panel: Recent Labs  Lab 05/09/24 0815 05/10/24 0835 05/11/24 0458 05/12/24 0258 05/13/24 0244  NA 135 132* 129* 133* 134*  K 3.4* 3.1* 3.0* 3.1* 4.0  CL 100 98 98 102 103  CO2 22 20* 21* 20* 19*  GLUCOSE 167* 120* 166* 111* 83  BUN 18 13 22  28*  15  CREATININE 0.53 0.46 0.62 0.68 0.55  CALCIUM 9.0 8.6* 8.7* 8.4* 8.7*  MG  --   --  1.6* 2.2 2.2  PHOS  --   --   --  3.1  --     GFR: CrCl cannot be calculated (Unknown ideal weight.).  Liver Function Tests: Recent Labs  Lab 05/08/24 1458 05/09/24 0537 05/10/24 0835  AST 22 <10* 27  ALT 19 5 16   ALKPHOS 257* 67 165*  BILITOT 1.5* 0.3 1.5*  PROT 7.7 <3.0* 5.9*  ALBUMIN 4.6 <1.5* 3.1*   Recent Labs  Lab 05/08/24 1516  LIPASE 25   No results for input(s): AMMONIA in Heavin last 168 hours.  Coagulation Profile: No results for input(s): INR, PROTIME in Mariaha last 168 hours.  Cardiac Enzymes: No results for input(s): CKTOTAL, CKMB, CKMBINDEX, TROPONINI in Chyan last 168 hours.  BNP (last 3 results) No results for input(s): PROBNP in Karron last 8760 hours.  Lipid Profile: No results for input(s): CHOL, HDL, LDLCALC, TRIG, CHOLHDL, LDLDIRECT in Darletta last 72 hours.   Thyroid Function Tests: No results for input(s): TSH, T4TOTAL, FREET4,  T3FREE, THYROIDAB in Avanthika last 72 hours.   Anemia Panel: No results for input(s): VITAMINB12, FOLATE, FERRITIN, TIBC, IRON, RETICCTPCT in Dellene last 72 hours.  Urine analysis:    Component Value Date/Time   COLORURINE YELLOW 05/08/2024 1924   APPEARANCEUR CLEAR 05/08/2024 1924   LABSPEC 1.022 05/08/2024 1924   PHURINE 5.0 05/08/2024 1924   GLUCOSEU >=500 (A) 05/08/2024 1924   HGBUR NEGATIVE 05/08/2024 1924   BILIRUBINUR NEGATIVE 05/08/2024 1924   KETONESUR 80 (A) 05/08/2024 1924   PROTEINUR 100 (A) 05/08/2024 1924   UROBILINOGEN 0.2 06/09/2010 1539   NITRITE POSITIVE (A) 05/08/2024 1924   LEUKOCYTESUR SMALL (A) 05/08/2024 1924    Sepsis Labs: Lactic Acid, Venous    Component Value Date/Time   LATICACIDVEN 2.1 (HH) 05/08/2024 2308    MICROBIOLOGY: Recent Results (from Pedro past 240 hours)  Urine Culture     Status: Abnormal   Collection Time: 05/08/24  7:24 PM   Specimen: Urine, Catheterized  Result Value Ref Range Status   Specimen Description   Final    URINE, CATHETERIZED Performed at [facility], [address], [place], [place] [address]    Special Requests   Final    NONE Performed at [facility], [address] [place], [place] [address]    Culture >=100,000 COLONIES/mL KLEBSIELLA PNEUMONIAE (A)  Final   Report Status 05/12/2024 FINAL  Final   Organism ID, Bacteria KLEBSIELLA PNEUMONIAE (A)  Final      Susceptibility   Klebsiella pneumoniae - MIC*    AMPICILLIN RESISTANT Resistant     CEFAZOLIN (URINE) Value in next row Sensitive      4 SENSITIVEThis is a modified FDA-approved test that has been validated and its performance characteristics determined by [facility] reporting laboratory.  This laboratory is certified under [place] Clinical Laboratory Improvement Amendments CLIA as qualified to perform high complexity clinical laboratory testing.    CEFEPIME Value in next row Sensitive      4 SENSITIVEThis is a  modified FDA-approved test that has been validated and its performance characteristics determined by [facility] reporting laboratory.  This laboratory is certified under [place] Clinical Laboratory Improvement Amendments CLIA as qualified to perform high complexity clinical laboratory testing.    ERTAPENEM Value in next row Sensitive      4 SENSITIVEThis is a modified FDA-approved test that has  been validated and its performance characteristics determined by Syd[facility] reporting laboratory.  This laboratory is certified under [place] Clinical Laboratory Improvement Amendments CLIA as qualified to perform high complexity clinical laboratory testing.    CEFTRIAXONE Value in next row Sensitive      4 SENSITIVEThis is a modified FDA-approved test that has been validated and its performance characteristics determined by [facility] reporting laboratory.  This laboratory is certified under [place] Clinical Laboratory Improvement Amendments CLIA as qualified to perform high complexity clinical laboratory testing.    CIPROFLOXACIN Value in next row Resistant      4 SENSITIVEThis is a modified FDA-approved test that has been validated and its performance characteristics determined by [facility] reporting laboratory.  This laboratory is certified under [place] Clinical Laboratory Improvement Amendments CLIA as qualified to perform high complexity clinical laboratory testing.    GENTAMICIN Value in next row Sensitive      4 SENSITIVEThis is a modified FDA-approved test that has been validated and its performance characteristics determined by Naseem reporting laboratory.  This laboratory is certified under [place] Clinical Laboratory Improvement Amendments CLIA as qualified to perform high complexity clinical laboratory testing.    NITROFURANTOIN Value in next row Resistant      4 SENSITIVEThis is a modified FDA-approved test that has been validated and its performance characteristics determined by Gloriaje[facility] reporting laboratory.  This laboratory is certified under [place]  Clinical Laboratory Improvement Amendments CLIA as qualified to perform high complexity clinical laboratory testing.    TRIMETH/SULFA Value in next row Sensitive      4 SENSITIVEThis is a modified FDA-approved test that has been validated and its performance characteristics determined by Selina reporting laboratory.  This laboratory is certified under [place] Clinical Laboratory Improvement Amendments CLIA as qualified to perform high complexity clinical laboratory testing.    AMPICILLIN/SULBACTAM Value in next row Sensitive      4 SENSITIVEThis is a modified FDA-approved test that has been validated and its performance characteristics determined by [facility] reporting laboratory.  This laboratory is certified under [place] Clinical Laboratory Improvement Amendments CLIA as qualified to perform high complexity clinical laboratory testing.    PIP/TAZO Value in next row Sensitive      8 SENSITIVEThis is a modified FDA-approved test that has been validated and its performance characteristics determined by [facility] reporting laboratory.  This laboratory is certified under [place] Clinical Laboratory Improvement Amendments CLIA as qualified to perform high complexity clinical laboratory testing.    MEROPENEM Value in next row Sensitive      8 SENSITIVEThis is a modified FDA-approved test that has been validated and its performance characteristics determined by [facility] reporting laboratory.  This laboratory is certified under [place] Clinical Laboratory Improvement Amendments CLIA as qualified to perform high complexity clinical laboratory testing.    * >=100,000 COLONIES/mL KLEBSIELLA PNEUMONIAE  Culture, blood (Routine X 2) w Reflex to ID Panel     Status: None (Preliminary result)   Collection Time: 05/08/24 10:49 PM   Specimen: BLOOD  Result Value Ref Range Status   Specimen Description   Final    BLOOD RIGHT ANTECUBITAL Performed at [facility], [address], [place], [place] [address]    Special Requests    Final    Blood Culture results may not be optimal due to an inadequate volume of blood received in culture bottles BOTTLES DRAWN AEROBIC AND ANAEROBIC Performed at [facility], [address], [place], [place] [address]    Culture   Final  NO GROWTH 4 DAYS Performed at [facility], [address] [place], [place] [address]    Report Status PENDING  Incomplete    RADIOLOGY STUDIES/RESULTS: No results found.    LOS: 4 days   Donalda Applebaum, MD  [facility]    To contact Julianna attending provider between 7A-7P or Kylinn covering provider during after hours 7P-7A, please log into [facility] web site [email or web address] and access using universal [facility] password for that web site. If you do not have Makayla password, please call Annaleigha hospital operator.  05/13/2024, 11:10 AM

## 2024-05-13 NOTE — Discharge Summary (Signed)
 PATIENT DETAILS Name: April Sheppard Age: 78 y.o. Sex: female Date of Birth: 10/04/1945 MRN: [id number]. Admitting Physician: Editha Ram, MD ERE:[id number], Ritchie BROCKS, MD (Inactive)  Admit Date: 05/08/2024 Discharge date: 05/13/2024  Recommendations for Outpatient Follow-up:  Follow up with PCP in 1-2 weeks Please obtain CMP/CBC in one week Please ensure follow up with stroke clinic Repeat voiding trial in a week or so Repeat CT Chest in 3-6 moths-lung nodule-see below Outpatient referral to cardiology-re aortic regurg Repeat TSH in 4-6 weeks.   Admitted From:  Home  Disposition: Rehabilitation facility-[facility] Inpatient rehab   Discharge Condition: fair  CODE STATUS:   Code Status: Full Code   Diet recommendation:  Diet Order             Diet - low sodium heart healthy           Diet Carb Modified           DIET - DYS 1 Room service appropriate? No; Fluid consistency: Thin  Diet effective now                    Brief Summary: Patient is a 78 y.o.  female with history of CAD s/p CABG, HTN, HLD, DM-2, hypothyroidism-who was admitted to [facility] with nausea/vomiting/lethargy-acute urinary retention-she was found to be in DKA-and acute ischemic CVA.   Significant events: 10/9>> admit to [facility] 10/10>> transferred to [facility] 10/13>> Foley removed-developed retention x 2 10/14>>Foley catheter reinserted   Significant studies: 10/9>> A1c: 13.2 10/10>> CTA head/neck: Left vertebral artery origin occlusion, severe right V2 vertebral artery stenosis, bilateral P2 PCA occlusion, severe left M2 MCA stenosis, left A2 ACA stenosis. 10/10>> MRI brain: Extensive acute posterior circulation infarct in Aleysia setting of vertebral artery occlusion-right> left PICA territory involvement.+ve cytotoxic edema. 10/10>> echo: EF 60-65%, grade 1 diastolic dysfunction.  Moderate aortic valve regurgitation. 10/11>> LDL: 189.   Significant microbiology data: 10/9>> blood culture: No  growth. 10/9>> urine culture: Klebsiella pneumoniae   Procedures: None   Consults: Neurology.  Brief Hospital Course: DKA Known history of DM-2-poor compliance with medications per prior notes. Resolved with IV insulin/IV fluid Now on SQ insulin   Acute B/L PICA territory (right> left) ischemic infarcts with cytotoxic edema Etiology felt to be secondary to severe intracranial atherosclerotic disease. Speaking in slow fluent speech-moving all 4 extremities Continue aspirin/Plavix x 3 months-then aspirin alone. Continue Lipitor 80 mg on discharge.   Hypertensive urgency Initially permissive hypertension was allowed Once patient was several days out from CVA-amlodipine  was restarted Blood pressure continues to be elevated-restart Coreg  at half of home dose.  Follow/optimize while at rehab.   HLD Lipitor.   DM-2 with uncontrolled hyperglycemia CBGs currently stable Continue Lantus 5 units daily+ SSI As diet advances-Will need adjustment of insulin regimen.  Hypothyroidism TSH mildly elevated-Synthroid has been increased to 100 mcg Repeat TSH in 4-6 weeks.   CAD s/p CABG No anginal symptoms On antiplatelet/statin   Complicated UTI Has Foley catheter-urine culture positive for Klebsiella Oral cefadroxil x 3 days from 10/13.   Acute urinary retention Failed voiding trial 10/13-Foley catheter reinserted 10/14. Consider repeating voiding trial in [place] next week or so Added flomax   Moderate aortic valve regurgitation Seen on echocardiogram Stable for outpatient follow-up with cardiology.   Hypokalemia/hypomagnesemia Repleted.   Hyponatremia Mild Stable for monitoring for now-she is asymptomatic.   Hospital delirium Received Seroquel last night-but was still agitated/restless Increase Seroquel to 50 mg nightly Add melatonin. Delirium precautions   Right  upper lobe nodular opacities Incidental finding on CT imaging. Radiology recommending repeat CT chest in  3 to 6 months   Class I obesity: Estimated body mass index is 30.04 kg/m as calculated from [place] following:   Height as of 05/17/11: 5' 4 (1.626 m).   Weight as of 05/17/11: 79.4 kg.    Discharge Diagnoses:  Principal Problem:   DKA (diabetic ketoacidosis) (HCC) Active Problems:   CVA (cerebral vascular accident) (HCC)   Hypertensive urgency   Acute urinary retention   CAD (coronary artery disease)   Discharge Instructions:  Activity:  As tolerated with Full fall precautions use walker/cane & assistance as needed   Discharge Instructions     Ambulatory referral to Neurology   Complete by: As directed    Follow up with stroke clinic NP at [facility] in about 4-6 weeks. Thanks.   Call MD for:  extreme fatigue   Complete by: As directed    Call MD for:  persistant dizziness or light-headedness   Complete by: As directed    Diet - low sodium heart healthy   Complete by: As directed    Diet Carb Modified   Complete by: As directed    Discharge instructions   Complete by: As directed    Follow with Primary MD  [name], MD (Inactive) in 1-2 weeks  Please get a complete blood count and chemistry panel checked by your Primary MD at your next visit, and again as instructed by your Primary MD.  Get Medicines reviewed and adjusted: Please take all your medications with you for your next visit with your Primary MD  Laboratory/radiological data: Please request your Primary MD to go over all hospital tests and procedure/radiological results at [place] follow up, please ask your Primary MD to get all Hospital records sent to his/her office.  In some cases, they will be blood work, cultures and biopsy results pending at Zerah time of your discharge. Please request that your primary care M.D. follows up on these results.  Also Note Breniya following: If you experience worsening of your admission symptoms, develop shortness of breath, life threatening emergency, suicidal or homicidal  thoughts you must seek medical attention immediately by calling 911 or calling your MD immediately  if symptoms less severe.  You must read complete instructions/literature along with all Shatasia possible adverse reactions/side effects for all Jannie Medicines you take and that have been prescribed to you. Take any new Medicines after you have completely understood and accpet all Kamea possible adverse reactions/side effects.   Do not drive when taking Pain medications or sleeping medications (Benzodaizepines)  Do not take more than prescribed Pain, Sleep and Anxiety Medications. It is not advisable to combine anxiety,sleep and pain medications without talking with your primary care practitioner  Special Instructions: If you have smoked or chewed Tobacco  in Emanuel last 2 yrs please stop smoking, stop any regular Alcohol  and or any Recreational drug use.  Wear Seat belts while driving.  Please note: You were cared for by a hospitalist during your hospital stay. Once you are discharged, your primary care physician will handle any further medical issues. Please note that NO REFILLS for any discharge medications will be authorized once you are discharged, as it is imperative that you return to your primary care physician (or establish a relationship with a primary care physician if you do not have one) for your post hospital discharge needs so that they can reassess your need for medications and monitor  your lab values.   Increase activity slowly   Complete by: As directed       Allergies as of 05/13/2024       Reactions   Lisinopril Swelling, Rash        Medication List     STOP taking these medications    hydrALAZINE 25 MG tablet Commonly known as: APRESOLINE   Jardiance 25 MG Tabs tablet Generic drug: empagliflozin       TAKE these medications    amLODipine  10 MG tablet Commonly known as: NORVASC  Take 1 tablet (10 mg total) by mouth daily.   aspirin EC 81 MG tablet Take 1 tablet  (81 mg total) by mouth daily. Swallow whole. Start taking on: May 14, 2024   atorvastatin 80 MG tablet Commonly known as: LIPITOR Take 1 tablet (80 mg total) by mouth daily. Start taking on: May 14, 2024   carvedilol  12.5 MG tablet Commonly known as: Coreg  Take 0.5 tablets (6.25 mg total) by mouth 2 (two) times daily with a meal. What changed: how much to take   cefadroxil 500 MG capsule Commonly known as: DURICEF Take 2 capsules (1,000 mg total) by mouth 2 (two) times daily for 2 days.   clopidogrel 75 MG tablet Commonly known as: PLAVIX Take 1 tablet (75 mg total) by mouth daily. Start taking on: May 14, 2024   feeding supplement Liqd Take 237 mLs by mouth 2 (two) times daily between meals.   insulin glargine 100 UNIT/ML injection Commonly known as: LANTUS Inject 0.05 mLs (5 Units total) into Shera skin daily. Start taking on: May 14, 2024   levothyroxine 100 MCG tablet Commonly known as: SYNTHROID Take 1 tablet (100 mcg total) by mouth daily at 6 (six) AM. Start taking on: May 14, 2024   Melatonin 10 MG Tabs Take 10 mg by mouth at bedtime.   metFORMIN 1000 MG tablet Commonly known as: GLUCOPHAGE Take 1,000 mg by mouth 2 (two) times daily with a meal.   NovoLOG FlexPen 100 UNIT/ML FlexPen Generic drug: insulin aspart 0-9 Units, Subcutaneous, 3 times daily with meals CBG < 70: Implement Hypoglycemia measures CBG 70 - 120: 0 units CBG 121 - 150: 1 unit CBG 151 - 200: 2 units CBG 201 - 250: 3 units CBG 251 - 300: 5 units CBG 301 - 350: 7 units CBG 351 - 400: 9 units CBG > 400: call MD   nystatin 100000 UNIT/ML suspension Commonly known as: MYCOSTATIN Take 5 mLs (500,000 Units total) by mouth 4 (four) times daily for 6 days.   QUEtiapine 50 MG tablet Commonly known as: SEROQUEL Take 1 tablet (50 mg total) by mouth at bedtime.   tamsulosin 0.4 MG Caps capsule Commonly known as: FLOMAX Take 1 capsule (0.4 mg total) by mouth daily.         Follow-up Information     [facility]. Schedule an appointment as soon as possible for a visit in 1 month(s).   Specialty: Neurology Why: stroke clinic Contact information: [address] [place]  [address] [phone]        [name], MD. Schedule an appointment as soon as possible for a visit in 1 week(s).   Specialty: Internal Medicine Contact information: [address] [place] [address] [phone]                Allergies  Allergen Reactions   Lisinopril Swelling and Rash     Other Procedures/Studies: VAS  US  LOWER EXTREMITY VENOUS (DVT) Result Date: 05/10/2024  Lower Venous DVT Study Patient Name:  Angellynn Wignall      Date of Exam:   05/10/2024 Medical Rec #: [id number]   Accession #:    [id number] Date of Birth: 12/02/1945  Patient Gender: F Patient Age:   34 years Exam [facility]  [facility] Procedure:      VAS US  LOWER EXTREMITY VENOUS (DVT) Referring Phys: ARY XU --------------------------------------------------------------------------------  Indications: Stroke.  Risk Factors: None identified. Limitations: Poor ultrasound/tissue interface and patient positioning. Comparison Study: No prior studies. Performing Technologist: Cordella Collet RVT  Examination Guidelines: A complete evaluation includes B-mode imaging, spectral Doppler, color Doppler, and power Doppler as needed of all accessible portions of each vessel. Bilateral testing is considered an integral part of a complete examination. Limited examinations for reoccurring indications may be performed as noted. Indigo reflux portion of Hawley exam is performed with Mallika patient in reverse Trendelenburg.  +---------+---------------+---------+-----------+----------+--------------+ RIGHT    CompressibilityPhasicitySpontaneityPropertiesThrombus Aging +---------+---------------+---------+-----------+----------+--------------+ CFV      Full            Yes      Yes                                 +---------+---------------+---------+-----------+----------+--------------+ SFJ      Full                                                        +---------+---------------+---------+-----------+----------+--------------+ FV Prox  Full                                                        +---------+---------------+---------+-----------+----------+--------------+ FV Mid   Full                                                        +---------+---------------+---------+-----------+----------+--------------+ FV DistalFull                                                        +---------+---------------+---------+-----------+----------+--------------+ PFV      Full                                                        +---------+---------------+---------+-----------+----------+--------------+ POP      Full           Yes      Yes                                 +---------+---------------+---------+-----------+----------+--------------+ PTV      Full                                                        +---------+---------------+---------+-----------+----------+--------------+  PERO     Full                                                        +---------+---------------+---------+-----------+----------+--------------+ Arterial occlusion is noted in Quaneshia distal femoral artery. Monophasic flow is noted in Kelcey popliteal, posterior tibial, peroneal, and anterior tibial arteries.  +---------+---------------+---------+-----------+----------+--------------+ LEFT     CompressibilityPhasicitySpontaneityPropertiesThrombus Aging +---------+---------------+---------+-----------+----------+--------------+ CFV      Full           Yes      Yes                                 +---------+---------------+---------+-----------+----------+--------------+ SFJ      Full                                                         +---------+---------------+---------+-----------+----------+--------------+ FV Prox  Full                                                        +---------+---------------+---------+-----------+----------+--------------+ FV Mid   Full                                                        +---------+---------------+---------+-----------+----------+--------------+ FV DistalFull                                                        +---------+---------------+---------+-----------+----------+--------------+ PFV      Full                                                        +---------+---------------+---------+-----------+----------+--------------+ POP      Full           Yes      Yes                                 +---------+---------------+---------+-----------+----------+--------------+ PTV      Full                                                        +---------+---------------+---------+-----------+----------+--------------+ PERO     Full                                                        +---------+---------------+---------+-----------+----------+--------------+  Arterial occlusion is noted in Adelene mid femoral artery. Monophaisc flow is noted in Lynasia distal femoral, popliteal, posterior tibial, peroneal, and anterior tibial arteries.    Summary: RIGHT: - There is no evidence of deep vein thrombosis in Tashari lower extremity.  - No cystic structure found in Nelva popliteal fossa. - Arterial occlusion is noted in Reyanna distal femoral artery. Monophasic flow is noted in Arnesia popliteal, posterior tibial, peroneal, and anterior tibial arteries.  LEFT: - There is no evidence of deep vein thrombosis in Liliya lower extremity.  - No cystic structure found in Sinclair popliteal fossa. - Arterial occlusion is noted in Ahnesty mid femoral artery. Monophaisc flow is noted in Osie distal femoral, popliteal, posterior tibial, peroneal, and anterior tibial arteries.  *See  table(s) above for measurements and observations. Electronically signed by Gaile New MD on 05/10/2024 at 10:12:13 PM.    Final    ECHOCARDIOGRAM COMPLETE Result Date: 05/09/2024    ECHOCARDIOGRAM REPORT   Patient Name:   Mayme Severe     Date of Exam: 05/09/2024 Medical Rec #:  [id number]  Height:       64.0 in Accession #:    [id number] Weight:       175.0 lb Date of Birth:  [birth year] BSA:          1.848 m Patient Age:    77 years   BP:           171/63 mmHg Patient Gender: F          HR:           65 bpm. Exam Location:  Inpatient Procedure: 2D Echo, 3D Echo, Cardiac Doppler, Color Doppler and Strain Analysis            (Both Spectral and Color Flow Doppler were utilized during            procedure). Indications:    I35.0 Nonrheumatic aortic (valve) stenosis  History:        Patient has no prior history of Echocardiogram examinations. CAD                 and Previous Myocardial Infarction, Prior CABG, Stroke; Risk                 Factors:Hypertension, Dyslipidemia and Diabetes.  Sonographer:    Ellouise Mose RDCS Referring Phys: [id number] VASUNDHRA RATHORE IMPRESSIONS  1. Left ventricular ejection fraction, by estimation, is 60 to 65%. Teriyah left ventricle has normal function. Cataleia left ventricle demonstrates regional wall motion abnormalities (see scoring diagram/findings for description). There is severe concentric left ventricular hypertrophy. Left ventricular diastolic parameters are consistent with Grade I diastolic dysfunction (impaired relaxation). There is mild hypokinesis of Aniyiah left ventricular, basal-mid anterolateral wall. Crissy average left ventricular global longitudinal strain is -15.5 %. Rafaella global longitudinal strain is abnormal.  2. Right ventricular systolic function is normal. Noreen right ventricular size is normal.  3. Left atrial size was mildly dilated.  4. Teri mitral valve is degenerative. Trivial mitral valve regurgitation. No evidence of mitral stenosis. Severe mitral annular calcification.  5. Nikeria  aortic valve is tricuspid. There is moderate calcification of Shanda aortic valve. There is moderate thickening of Antoinette aortic valve. Aortic valve regurgitation is moderate. Aortic valve sclerosis/calcification is present, without any evidence of aortic stenosis.  6. Ilhan inferior vena cava is normal in size with greater than 50% respiratory variability, suggesting right atrial pressure of 3 mmHg. Comparison(s): No prior Echocardiogram. FINDINGS  Left Ventricle: Left  ventricular ejection fraction, by estimation, is 60 to 65%. Tannis left ventricle has normal function. Karra left ventricle demonstrates regional wall motion abnormalities. Mild hypokinesis of Lin left ventricular, basal-mid anterolateral wall. Avaley average left ventricular global longitudinal strain is -15.5 %. Strain was performed and Sreshta global longitudinal strain is abnormal. 3D ejection fraction reviewed and evaluated as part of Spring interpretation. Alternate measurement of EF is felt to be most reflective of LV function. Samuel left ventricular internal cavity size was normal in size. There is severe concentric left ventricular hypertrophy. Left ventricular diastolic parameters are consistent with Grade I diastolic dysfunction (impaired relaxation). Right Ventricle: Esty right ventricular size is normal. No increase in right ventricular wall thickness. Right ventricular systolic function is normal. Left Atrium: Left atrial size was mildly dilated. Right Atrium: Right atrial size was normal in size. Pericardium: There is no evidence of pericardial effusion. Mitral Valve: Mobile calcifications present on chordae. Ilina mitral valve is degenerative in appearance. Severe mitral annular calcification. Trivial mitral valve regurgitation. No evidence of mitral valve stenosis. Tricuspid Valve: Ellin tricuspid valve is normal in structure. Tricuspid valve regurgitation is trivial. No evidence of tricuspid stenosis. Aortic Valve: Raiven aortic valve is tricuspid. There is  moderate calcification of Samyuktha aortic valve. There is moderate thickening of Deshanda aortic valve. Aortic valve regurgitation is moderate. Aortic regurgitation PHT measures 419 msec. Aortic valve sclerosis/calcification is present, without any evidence of aortic stenosis. Aortic valve mean gradient measures 6.8 mmHg. Aortic valve peak gradient measures 12.8 mmHg. Aortic valve area, by VTI measures 2.38 cm. Pulmonic Valve: Dyanara pulmonic valve was not well visualized. Pulmonic valve regurgitation is not visualized. No evidence of pulmonic stenosis. Aorta: Arelia aortic root, ascending aorta, aortic arch and descending aorta are all structurally normal, with no evidence of dilitation or obstruction. Venous: Alanys left upper pulmonary vein is normal. Kenedy inferior vena cava is normal in size with greater than 50% respiratory variability, suggesting right atrial pressure of 3 mmHg. IAS/Shunts: No atrial level shunt detected by color flow Doppler. Additional Comments: 3D was performed not requiring image post processing on an independent workstation and was indeterminate.  LEFT VENTRICLE PLAX 2D LVIDd:         4.20 cm     Diastology LVIDs:         2.70 cm     LV e' medial:    3.15 cm/s LV PW:         1.83 cm     LV E/e' medial:  24.9 LV IVS:        1.73 cm     LV e' lateral:   3.26 cm/s LVOT diam:     2.20 cm     LV E/e' lateral: 24.1 LV SV:         84 LV SV Index:   45          2D Longitudinal Strain LVOT Area:     3.80 cm    2D Strain GLS (A4C):   -17.5 % LV IVRT:       85 msec     2D Strain GLS (A3C):   -12.6 %                            2D Strain GLS (A2C):   -16.3 %  2D Strain GLS Avg:     -15.5 % LV Volumes (MOD) LV vol d, MOD A2C: 77.5 ml LV vol d, MOD A4C: 59.5 ml LV vol s, MOD A2C: 31.7 ml LV vol s, MOD A4C: 25.0 ml LV SV MOD A2C:     45.8 ml LV SV MOD A4C:     59.5 ml LV SV MOD BP:      40.7 ml RIGHT VENTRICLE             IVC RV S prime:     10.80 cm/s  IVC diam: 1.70 cm TAPSE (M-mode): 1.8 cm  LEFT ATRIUM           Index        RIGHT ATRIUM           Index LA diam:      3.70 cm 2.00 cm/m   RA Area:     11.80 cm LA Vol (A2C): 35.5 ml 19.21 ml/m  RA Volume:   23.00 ml  12.44 ml/m LA Vol (A4C): 38.4 ml 20.75 ml/m  AORTIC VALVE AV Area (Vmax):    2.36 cm AV Area (Vmean):   2.34 cm AV Area (VTI):     2.38 cm AV Vmax:           179.00 cm/s AV Vmean:          116.250 cm/s AV VTI:            0.353 m AV Peak Grad:      12.8 mmHg AV Mean Grad:      6.8 mmHg LVOT Vmax:         111.00 cm/s LVOT Vmean:        71.700 cm/s LVOT VTI:          0.221 m LVOT/AV VTI ratio: 0.63 AI PHT:            419 msec  AORTA Ao Root diam: 2.90 cm Ao Asc diam:  3.50 cm MITRAL VALVE MV Area (PHT): 2.76 cm     SHUNTS MV Decel Time: 275 msec     Systemic VTI:  0.22 m MV E velocity: 78.50 cm/s   Systemic Diam: 2.20 cm MV A velocity: 114.00 cm/s MV E/A ratio:  0.69 Shelda Bruckner MD Electronically signed by Shelda Bruckner MD Signature Date/Time: 05/09/2024/8:08:03 PM    Final    MR BRAIN WO CONTRAST Result Date: 05/09/2024 EXAM: MR Brain without Intravenous Contrast. CLINICAL HISTORY: Transient ischemic attack (TIA), abnormal CT, MRI requested by radiologist. Patient not alert and oriented, used Fast Brain Protocol. TECHNIQUE: Magnetic resonance images of Eyvette brain without intravenous contrast in multiple planes. CONTRAST: Without. COMPARISON: Head CT 05/09/2024, CTA head and neck 05/08/2024, and CT head 05/08/2024. FINDINGS: BRAIN: Extensive acute posterior circulation infarcts in Tekeyah setting of vertebral artery occlusion. This includes confluent restricted diffusion throughout Toneshia right cerebellar PICA territory, moderate patchy restricted diffusion in Carmine contralateral left cerebellum PICA territory, associated right lateral medullary diffusion restricted infarction, and associated bifalamic infarcts with restricted diffusion. Early T2 and FLAIR hyperintense cytotoxic edema is present in Kelle acutely affected  areas. There is no hemorrhagic transformation or mass effect. No other diffusion restriction is identified. Underlying chronic cerebellar infarcts are more apparent on Jezebel left. Chronic microhemorrhage is present in Cary right superior frontal gyrus. Chronic encephalomalacia is noted in Shandelle anterior left middle and superior frontal gyri, right greater than left occipital poles, left inferior occipital lobe, and posteromesial left temporal lobe, as seen by  CT. Superimposed chronic lacunar infarcts are present in Fujie left basal ganglia. Underlying chronic ischemic disease involves Dottie left MCA and bilateral PCA territories. No intracranial mass. No midline shift or extra-axial fluid collection. No cerebellar tonsillar ectopia. Grossly preserved major vascular flow voids; see CTA findings yesterday. VENTRICLES: No hydrocephalus. ORBITS: Masaye orbits are normal. SINUSES AND MASTOIDS: Shakiah sinuses and mastoid air cells are clear. BONES: No acute fracture or focal osseous lesion. IMPRESSION: 1. 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 2. Cytotoxic edema without hemorrhagic transformation or mass effect. 3. 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 Electronically signed by: Helayne Hurst MD 05/09/2024 07:33 AM EDT RP Workstation: [id number]   CT HEAD WO CONTRAST ( ) Result Date: 05/09/2024 EXAM: CT HEAD WITHOUT CONTRAST 05/09/2024 04:37:23 AM TECHNIQUE: CT of Karalynn head was performed without Nalea administration of intravenous contrast. Automated exposure control, iterative reconstruction, and/or weight based adjustment of Lindsy mA/kV was utilized to reduce Doll radiation dose to as low as reasonably achievable. COMPARISON: Head CT and CTA head and neck 05/08/2024. Head CT 10/03/2021. CLINICAL HISTORY: 78 year old female with neuro  deficit, acute, stroke suspected. Change in mental status since previous CT angio head. 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 Evidence of cerebellar infarcts on head CT 05/08/2024. FINDINGS: BRAIN AND VENTRICLES: Overall cerebral volume stable from 2023 head CT with unchanged chronic encephalomalacia in Jamella bilateral PCA territories and anterior left middle frontal gyrus since that time. Multifocal deep perianal lacunar infarcts are stable from 05/08/2024. Patchy and confluent right greater than left inferior cerebellar infarcts redemonstrated and stable, right PICA territory involvement actually make that right PICA territory most affected. Circumscribed and chronic round 7 mm left mid cerebellar infarct (coronal image 54) which was present in 2023. No intracranial mass effect or ventriculomegaly. Some residual intravascular contrast. No acute intracranial hemorrhage identified. ORBITS: No acute abnormality. SINUSES: No acute abnormality. SOFT TISSUES AND SKULL: No acute soft tissue abnormality. No skull fracture. Advanced calcified atherosclerosis at Kechia skull base. IMPRESSION: 1. Unchanged noncontrast CT appearance of Arrion brain since yesterday. 2. Patchy and confluent right greater than left inferior cerebellar infarcts, no hemorrhagic transformation or mass effect 3. Underlying advanced chronic ischemic disease. Electronically signed by: Helayne Hurst MD 05/09/2024 04:46 AM EDT RP Workstation: [id number]   CT ANGIO HEAD NECK W WO CM Result Date: 05/09/2024 EXAM: CTA HEAD AND NECK WITHOUT AND WITH IV CONTRAST 05/08/2024 11:55:00 PM TECHNIQUE: CTA of Shaneisha head and neck was performed without and with Karishma administration of 75mL iohexol (OMNIPAQUE) 350 MG/ML injection 75 mL IOHEXOL 350 MG/ML SOLN intravenous contrast. Multiplanar 2D and/or 3D reformatted images are provided for review. Automated exposure control, iterative reconstruction, and/or weight based adjustment of Keary mA/kV was utilized to reduce Steffany  radiation dose to as low as reasonably achievable. Stenosis of Bonnee internal carotid arteries measured using NASCET criteria. COMPARISON: Same day CT head CLINICAL HISTORY: Transient ischemic attack (TIA). FINDINGS: AORTIC ARCH AND ARCH VESSELS: Ladashia origins are patent. Aortic atherosclerosis. CERVICAL CAROTID ARTERIES: No dissection, arterial injury, or hemodynamically significant stenosis by NASCET criteria. CERVICAL VERTEBRAL ARTERIES: Occluded Left vertebral artery origin with nonopacification throughout Ambra neck. Severe right v2 vertebral artery stenosis. SABRA LUNGS AND MEDIASTINUM: Unremarkable. SOFT TISSUES: No acute abnormality. BONES: No acute abnormality. ANTERIOR CIRCULATION: Severe left and moderate right intracranial atherosclerosis. No significant stenosis of Joliet anterior cerebral arteries. Severe left  M2 MCA stenosis. Severely stenotic vs occluded mid right M2 MCA with distal reconstitution. No aneurysm. POSTERIOR CIRCULATION: Severe atherosclerosis of bilateral vertebral arteries with occlusion of Lyncoln distal right vertebral artery and nonopacification of Reola left vertebral artery. Reconstitution of Kelie basilar artery, which is severely diseased by atherosclerosis as well with severe stenosis and small caliber. Yulitza PCAs are small proximally with bilateral p2 PCA occlusion. OTHER: No dural venous sinus thrombosis on this non-dedicated study. IMPRESSION: 1. Left vertebral artery origin occlusion with nonopacification in Jeania neck and intradurally. 2. Severe right V2 vertebral artery stenosis with occlusion of Oney right intradural vertebral artery. 3. Bilateral P2 PCA occlusion. 4. Small and severely stenotic basilar artery. 5. Severely stenotic vs occluded mid right M2 MCA with distal reconstitution. 6. Severe left and moderate right intracranial ICA stenosis. 7. Severe left M2 MCA stenosis. 8. Severe left A2 ACA stenosis. 9. Right upper lobe nodular opacities, most likely infectious/inflammatory.  Non-contrast chest CT at 3-6 months is recommended. If Shamel nodules are stable at time of repeat CT, then future CT at 18-24 months (from today's scan) is considered optional for low-risk patients, but is recommended for high-risk patients. This recommendation follows Rosalynn consensus statement: Guidelines for Management of Incidental Pulmonary Nodules Detected on CT Images: From Soraida Fleischner Society 2017; Radiology 2017; [id number]. Findings discussed with PA Carlo Cornish via telephone at 1:08 AM Electronically signed by: Gilmore Molt MD 05/09/2024 01:10 AM EDT RP Workstation: [id number]   CT Head Wo Contrast Addendum Date: 05/08/2024 ADDENDUM REPORT: 05/08/2024 19:20 ADDENDUM: These results were called by telephone at Jocelyn time of interpretation on 05/08/2024 at 7:20 pm to provider RILEY RANSOM , who verbally acknowledged these results. Electronically Signed   By: Morgane  Naveau M.D.   On: 05/08/2024 19:20   Result Date: 05/08/2024 CLINICAL DATA:  Headache, new onset (Age >= 51y) EXAM: CT HEAD WITHOUT CONTRAST TECHNIQUE: Contiguous axial images were obtained from Kamori base of Delfina skull through Sanaa vertex without intravenous contrast. RADIATION DOSE REDUCTION: This exam was performed according to Myiah departmental dose-optimization program which includes automated exposure control, adjustment of Terria mA and/or kV according to patient size and/or use of iterative reconstruction technique. COMPARISON:  CT head 10/03/2021 FINDINGS: Brain: Interval development of loss of gray-white matter differentiation with associated vasogenic edema of Angelique bilateral cerebellar hemispheres, right greater than left. Underlying chronic bilateral cerebellar infarctions. Left frontal and right occipital encephalomalacia. Patchy and confluent areas of decreased attenuation are noted throughout Devra deep and periventricular white matter of Analyssa cerebral hemispheres bilaterally, compatible with chronic microvascular ischemic disease. No  parenchymal hemorrhage. No mass lesion. No extra-axial collection. No mass effect or midline shift. No hydrocephalus. Basilar cisterns are patent. Vascular: No hyperdense vessel. Atherosclerotic calcifications are present within Shakita cavernous internal carotid and vertebral arteries. Skull: No acute fracture or focal lesion. Sinuses/Orbits: Paranasal sinuses and mastoid air cells are clear. Kiley orbits are unremarkable. Other: None. IMPRESSION: 1. 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 Recommend MRI without contrast for further evaluation. 2. No acute intracranial hemorrhage. Electronically Signed: By: Morgane  Naveau M.D. On: 05/08/2024 19:17   CT ABDOMEN PELVIS W CONTRAST Result Date: 05/08/2024 CLINICAL DATA:  LLQ abdominal pain EXAM: CT ABDOMEN AND PELVIS WITH CONTRAST TECHNIQUE: Multidetector CT imaging of Tammee abdomen and pelvis was performed using Kayren standard protocol following bolus administration of intravenous contrast. RADIATION DOSE REDUCTION: This exam was performed according to Nashika departmental dose-optimization program which includes automated exposure control, adjustment of  Aayana mA and/or kV according to patient size and/or use of iterative reconstruction technique. CONTRAST:  100mL OMNIPAQUE IOHEXOL 300 MG/ML  SOLN COMPARISON:  None Available. FINDINGS: Lower chest: Coronary artery calcification. Mitral annular calcification. Aortic valve leaflet calcification. Atherosclerotic plaque. Hepatobiliary: Stavroula hepatic parenchyma is diffusely hypodense compared to Allean splenic parenchyma consistent with fatty infiltration. No focal liver abnormality. No gallstones, gallbladder wall thickening, or pericholecystic fluid. No biliary dilatation. Pancreas: No focal lesion. Normal pancreatic contour. No surrounding inflammatory changes. No main pancreatic ductal dilatation. Spleen: Normal in size without focal abnormality.  Splenule noted Adrenals/Urinary Tract:  No adrenal nodule bilaterally. Bilateral kidneys enhance symmetrically. No hydronephrosis. No hydroureter. Sharonann urinary bladder is distended with urine. On delayed imaging, there is no urothelial wall thickening and there are no filling defects in Anwyn opacified portions of Janelle bilateral collecting systems or ureters. Stomach/Bowel: Stomach is within normal limits. No evidence of bowel wall thickening or dilatation. Colonic diverticulosis. Appendix appears normal. Vascular/Lymphatic: No abdominal aorta or iliac aneurysm. Severe atherosclerotic plaque of Von aorta and its branches. No abdominal, pelvic, or inguinal lymphadenopathy. Reproductive: Uterus and bilateral adnexa are unremarkable. Other: No intraperitoneal free fluid. No intraperitoneal free gas. No organized fluid collection. Musculoskeletal: No abdominal wall hernia or abnormality. Diffusely decreased bone density. No suspicious lytic or blastic osseous lesions. No acute displaced fracture. Multilevel degenerative changes of Quisha spine. IMPRESSION: 1. No acute intra-abdominal or intrapelvic abnormality. 2. Hepatic steatosis. 3. Colonic diverticulosis. 4. Aortic Atherosclerosis (ICD10-I70.0) including mitral annular, coronary artery, aortic valve leaflet calcifications-correlate for aortic stenosis. Electronically Signed   By: Morgane  Naveau M.D.   On: 05/08/2024 19:12   DG Chest 2 View Result Date: 05/08/2024 CLINICAL DATA:  Hypertension. Hyperglycemia. Unwell feeling over Alayiah past few days. Dizziness and weakness. Vomiting. EXAM: CHEST - 2 VIEW COMPARISON:  11/05/2023 FINDINGS: Postoperative changes in Deleah mediastinum. Heart size and pulmonary vascularity are normal for technique. Lungs are clear. No pleural effusion or pneumothorax. Mediastinal contours appear intact. Calcification of Shanty aorta. Degenerative changes in Terriann spine. IMPRESSION: No active cardiopulmonary disease. Electronically Signed   By: Elsie Gravely M.D.   On: 05/08/2024 16:51      TODAY-DAY OF DISCHARGE:  Subjective:   Girtha Stormes today has no headache,no chest abdominal pain,no new weakness tingling or numbness, feels much better wants to go home today.   Objective:   Blood pressure (!) 159/67, pulse 82, temperature (!) 97.5 F (36.4 C), temperature source Axillary, resp. rate 15, weight 55.5 kg, SpO2 97%.  Intake/Output Summary (Last 24 hours) at 05/13/2024 1450 Last data filed at 05/13/2024 0029 Gross per 24 hour  Intake --  Output 1000 ml  Net -1000 ml   Filed Weights   05/09/24 2048  Weight: 55.5 kg    Exam: Awake Alert, Oriented *3, No new F.N deficits, Normal affect [place].AT,PERRAL Supple Neck,No JVD, No cervical lymphadenopathy appriciated.  Symmetrical Chest wall movement, Good air movement bilaterally, CTAB RRR,No Gallops,Rubs or new Murmurs, No Parasternal Heave +ve B.Sounds, Abd Soft, Non tender, No organomegaly appriciated, No rebound -guarding or rigidity. No Cyanosis, Clubbing or edema, No new Rash or bruise   PERTINENT RADIOLOGIC STUDIES: No results found.   PERTINENT LAB RESULTS: CBC: Recent Labs    05/11/24 0458  WBC 12.0*  HGB 15.8*  HCT 44.1  PLT 250   CMET CMP     Component Value Date/Time   NA 134 (L) 05/13/2024 0244   K 4.0 05/13/2024 0244   CL 103 05/13/2024 0244  CO2 19 (L) 05/13/2024 0244   GLUCOSE 83 05/13/2024 0244   BUN 15 05/13/2024 0244   CREATININE 0.55 05/13/2024 0244   CALCIUM 8.7 (L) 05/13/2024 0244   PROT 5.9 (L) 05/10/2024 0835   ALBUMIN 3.1 (L) 05/10/2024 0835   AST 27 05/10/2024 0835   ALT 16 05/10/2024 0835   ALKPHOS 165 (H) 05/10/2024 0835   BILITOT 1.5 (H) 05/10/2024 0835   GFRNONAA >60 05/13/2024 0244    GFR CrCl cannot be calculated (Unknown ideal weight.). No results for input(s): LIPASE, AMYLASE in Fianna last 72 hours. No results for input(s): CKTOTAL, CKMB, CKMBINDEX, TROPONINI in Zineb last 72 hours. Invalid input(s): POCBNP No results for input(s): DDIMER in  Gesenia last 72 hours. No results for input(s): HGBA1C in Elianna last 72 hours. No results for input(s): CHOL, HDL, LDLCALC, TRIG, CHOLHDL, LDLDIRECT in Alaiya last 72 hours. No results for input(s): TSH, T4TOTAL, T3FREE, THYROIDAB in Elmina last 72 hours.  Invalid input(s): FREET3 No results for input(s): VITAMINB12, FOLATE, FERRITIN, TIBC, IRON, RETICCTPCT in Reyna last 72 hours. Coags: No results for input(s): INR in Deshanae last 72 hours.  Invalid input(s): PT Microbiology: Recent Results (from Lil past 240 hours)  Urine Culture     Status: Abnormal   Collection Time: 05/08/24  7:24 PM   Specimen: Urine, Catheterized  Result Value Ref Range Status   Specimen Description   Final    URINE, CATHETERIZED Performed at [facility], [address] [place], [place] [address]    Special Requests   Final    NONE Performed at [facility], [address], [place], [place] [address]    Culture >=100,000 COLONIES/mL KLEBSIELLA PNEUMONIAE (A)  Final   Report Status 05/12/2024 FINAL  Final   Organism ID, Bacteria KLEBSIELLA PNEUMONIAE (A)  Final      Susceptibility   Klebsiella pneumoniae - MIC*    AMPICILLIN RESISTANT Resistant     CEFAZOLIN (URINE) Value in next row Sensitive      4 SENSITIVEThis is a modified FDA-approved test that has been validated and its performance characteristics determined by Susen reporting laboratory.  This laboratory is certified under Ahnyla Clinical Laboratory Improvement Amendments CLIA as qualified to perform high complexity clinical laboratory testing.    CEFEPIME Value in next row Sensitive      4 SENSITIVEThis is a modified FDA-approved test that has been validated and its performance characteristics determined by Eliany reporting laboratory.  This laboratory is certified under Korrine Clinical Laboratory Improvement Amendments CLIA as qualified to perform high complexity clinical laboratory testing.     ERTAPENEM Value in next row Sensitive      4 SENSITIVEThis is a modified FDA-approved test that has been validated and its performance characteristics determined by Vaneza reporting laboratory.  This laboratory is certified under Samauri Clinical Laboratory Improvement Amendments CLIA as qualified to perform high complexity clinical laboratory testing.    CEFTRIAXONE Value in next row Sensitive      4 SENSITIVEThis is a modified FDA-approved test that has been validated and its performance characteristics determined by Kelty reporting laboratory.  This laboratory is certified under Barbaraann Clinical Laboratory Improvement Amendments CLIA as qualified to perform high complexity clinical laboratory testing.    CIPROFLOXACIN Value in next row Resistant      4 SENSITIVEThis is a modified FDA-approved test that has been validated and its performance characteristics determined by Jessilyn reporting laboratory.  This laboratory is certified under Rechy Clinical Laboratory Improvement Amendments CLIA as  qualified to perform high complexity clinical laboratory testing.    GENTAMICIN Value in next row Sensitive      4 SENSITIVEThis is a modified FDA-approved test that has been validated and its performance characteristics determined by Tanetta reporting laboratory.  This laboratory is certified under Zoye Clinical Laboratory Improvement Amendments CLIA as qualified to perform high complexity clinical laboratory testing.    NITROFURANTOIN Value in next row Resistant      4 SENSITIVEThis is a modified FDA-approved test that has been validated and its performance characteristics determined by Aidah reporting laboratory.  This laboratory is certified under Jesselle Clinical Laboratory Improvement Amendments CLIA as qualified to perform high complexity clinical laboratory testing.    TRIMETH/SULFA Value in next row Sensitive      4 SENSITIVEThis is a modified FDA-approved test that has been validated and its performance characteristics determined by  Samaa reporting laboratory.  This laboratory is certified under Riane Clinical Laboratory Improvement Amendments CLIA as qualified to perform high complexity clinical laboratory testing.    AMPICILLIN/SULBACTAM Value in next row Sensitive      4 SENSITIVEThis is a modified FDA-approved test that has been validated and its performance characteristics determined by Dianelys reporting laboratory.  This laboratory is certified under Jaana Clinical Laboratory Improvement Amendments CLIA as qualified to perform high complexity clinical laboratory testing.    PIP/TAZO Value in next row Sensitive      8 SENSITIVEThis is a modified FDA-approved test that has been validated and its performance characteristics determined by Zuleima reporting laboratory.  This laboratory is certified under Jelissa Clinical Laboratory Improvement Amendments CLIA as qualified to perform high complexity clinical laboratory testing.    MEROPENEM Value in next row Sensitive      8 SENSITIVEThis is a modified FDA-approved test that has been validated and its performance characteristics determined by Zachary reporting laboratory.  This laboratory is certified under Jatziry Clinical Laboratory Improvement Amendments CLIA as qualified to perform high complexity clinical laboratory testing.    * >=100,000 COLONIES/mL KLEBSIELLA PNEUMONIAE  Culture, blood (Routine X 2) w Reflex to ID Panel     Status: None (Preliminary result)   Collection Time: 05/08/24 10:49 PM   Specimen: BLOOD  Result Value Ref Range Status   Specimen Description   Final    BLOOD RIGHT ANTECUBITAL Performed at [facility], [address], [place], [place] [address]    Special Requests   Final    Blood Culture results may not be optimal due to an inadequate volume of blood received in culture bottles BOTTLES DRAWN AEROBIC AND ANAEROBIC Performed at [facility], [address], [place], [place] [address]    Culture   Final    NO GROWTH 4 DAYS Performed  at [facility], [address], [place], [place] [address]    Report Status PENDING  Incomplete    FURTHER DISCHARGE INSTRUCTIONS:  Get Medicines reviewed and adjusted: Please take all your medications with you for your next visit with your Primary MD  Laboratory/radiological data: Please request your Primary MD to go over all hospital tests and procedure/radiological results at Abygail follow up, please ask your Primary MD to get all Hospital records sent to his/her office.  In some cases, they will be blood work, cultures and biopsy results pending at Lysa time of your discharge. Please request that your primary care M.D. goes through all Dontasia records of your hospital data and follows up on these results.  Also Note Falyn  following: If you experience worsening of your admission symptoms, develop shortness of breath, life threatening emergency, suicidal or homicidal thoughts you must seek medical attention immediately by calling 911 or calling your MD immediately  if symptoms less severe.  You must read complete instructions/literature along with all Adrean possible adverse reactions/side effects for all Quisha Medicines you take and that have been prescribed to you. Take any new Medicines after you have completely understood and accpet all Jalaiyah possible adverse reactions/side effects.   Do not drive when taking Pain medications or sleeping medications (Benzodaizepines)  Do not take more than prescribed Pain, Sleep and Anxiety Medications. It is not advisable to combine anxiety,sleep and pain medications without talking with your primary care practitioner  Special Instructions: If you have smoked or chewed Tobacco  in Jesyca last 2 yrs please stop smoking, stop any regular Alcohol  and or any Recreational drug use.  Wear Seat belts while driving.  Please note: You were cared for by a hospitalist during your hospital stay. Once you are discharged, your primary care physician will handle any further  medical issues. Please note that NO REFILLS for any discharge medications will be authorized once you are discharged, as it is imperative that you return to your primary care physician (or establish a relationship with a primary care physician if you do not have one) for your post hospital discharge needs so that they can reassess your need for medications and monitor your lab values.  Total Time spent coordinating discharge including counseling, education and face to face time equals greater than 30 minutes.  Signed: Vernell Townley 05/13/2024 2:50 PM

## 2024-05-13 NOTE — Progress Notes (Signed)
 Physical Therapy Treatment Patient Details Name: April Sheppard MRN: [id number] DOB: May 06, 1946 Today's Date: 05/13/2024   History of Present Illness Patient is a 78 y.o.  female who was admitted to [facility] with nausea/vomiting/lethargy-acute urinary retention-she was found to be in DKA-and acute ischemic CVA. Past medical history of CAD s/p CABG, HTN, HLD, DM-2, hypothyroidism.    PT Comments  Pt remains very sleepy. Pt's granddaughters report she stays awake at night and doesn't wake up until 3-4pm and that she is a night owl at home. Granddaughters report patient using all 4 extremities purposefully in April Sheppard bed and pt is engaging with family verbally. This date pt reports dizziness when sitting up EOB. Pt's BP did drop from 188/77 to 128/80 and improved to 154/69 by end of session. Pt continues to require maxAX2 for OOB transfer to chair. Continue to recommend aggressive inpatient rehab program > 3 hrs to achieve maximal functional recovery. Acute PT to cont to follow.    If plan is discharge home, recommend [name] following: Two people to help with walking and/or transfers;A lot of help with bathing/dressing/bathroom;Assistance with cooking/housework;Direct supervision/assist for medications management;Direct supervision/assist for financial management;Assist for transportation;Help with stairs or ramp for entrance;Supervision due to cognitive status   Can travel by private vehicle        Equipment Recommendations  Wheelchair (measurements PT);Wheelchair cushion (measurements PT);Hospital bed    Recommendations for Other Services Rehab consult     Precautions / Restrictions Precautions Precautions: Fall Recall of Precautions/Restrictions: Impaired Restrictions Weight Bearing Restrictions Per Provider Order: No     Mobility  Bed Mobility Overal bed mobility: Needs Assistance Bed Mobility: Supine to Sit     Supine to sit: Used rails, Max assist     General bed mobility comments: max  directional verbal and tactile cues, pt with active participation    Transfers Overall transfer level: Needs assistance Equipment used: 2 person hand held assist (face to face transfer with gait belt) Transfers: Sit to/from Stand, Bed to chair/wheelchair/BSC Sit to Stand: Max assist, +2 physical assistance   Step pivot transfers: Max assist, +2 physical assistance       General transfer comment: pt with good initiation however with poor trunk control and difficulty sequencing steps to chair requiring maxAx2 to safely transfer    Ambulation/Gait               General Gait Details: limited to steps to chair   Stairs             Wheelchair Mobility     Tilt Bed    Modified Rankin (Stroke Patients Only)       Balance Overall balance assessment: Needs assistance Sitting-balance support: Bilateral upper extremity supported, Feet supported Sitting balance-[name] Scale: Poor Sitting balance - Comments: pt initially able to sit EOB with close contact guard however then progressed to needing maxA more so due to anterior LOB tendencies Postural control: Right lateral lean                                  Communication Communication Communication: Impaired Factors Affecting Communication: Reduced clarity of speech;Other (comment) (non-english speaking)  Cognition Arousal: Lethargic Behavior During Therapy: Flat affect   PT - Cognitive impairments: Difficult to assess Difficult to assess due to: Non-English speaking (granddaughters present to translate)                     PT -  Cognition Comments: Pt very lethargic and non english speaking. Following commands: Impaired Following commands impaired: Follows one step commands inconsistently, Follows one step commands with increased time    Cueing Cueing Techniques: Verbal cues, Tactile cues  Exercises Other Exercises Other Exercises: worked on sitting EOB balance, pt with difficulty keeping  head up due to lethargy and poor trunk control    General Comments General comments (skin integrity, edema, etc.): BP dropped from 188/77 to 128/80 when sitting up on EOB, pt with c/o dizziness,, pt improved to 140/65 s/p 9 min of sitting, 170/72 once transferred to April Sheppard chair and then BP 154/69 with April Sheppard elevated in recliner      Pertinent Vitals/Pain Pain Assessment Pain Assessment: No/denies pain    Home Living                          Prior Function            PT Goals (current goals can now be found in [facility] care plan section) Acute Rehab PT Goals PT Goal Formulation: Patient unable to participate in goal setting Time For Goal Achievement: 05/24/24 Potential to Achieve Goals: Fair Progress towards PT goals: Progressing toward goals    Frequency    Min 3X/week      PT Plan      Co-evaluation              AM-PAC PT 6 Clicks Mobility   Outcome Measure  Help needed turning from your back to your side while in a flat bed without using bedrails?: A Lot Help needed moving from lying on your back to sitting on [place] side of a flat bed without using bedrails?: A Lot Help needed moving to and from a bed to a chair (including a wheelchair)?: A Lot Help needed standing up from a chair using your arms (e.g., wheelchair or bedside chair)?: Total Help needed to walk in hospital room?: Total Help needed climbing 3-5 steps with a railing? : Total 6 Click Score: 9    End of Session Equipment Utilized During Treatment: Gait belt Activity Tolerance: Patient limited by lethargy Patient left: with call bell/phone within reach;in chair;with chair alarm set;with family/visitor present Nurse Communication: Mobility status (High BP) PT Visit Diagnosis: Other abnormalities of gait and mobility (R26.89)     Time: 1137-1207 PT Time Calculation (min) (ACUTE ONLY): 30 min  Charges:    $Therapeutic Activity: 8-22 mins $Neuromuscular Re-education: 8-22 mins PT General  Charges $$ ACUTE PT VISIT: 1 Visit                     Norene Ames, PT, DPT Acute Rehabilitation Services Secure chat preferred Office #: [phone]    Norene CHRISTELLA Ames 05/13/2024, 1:08 PM

## 2024-05-14 LAB — CULTURE, BLOOD (ROUTINE X 2): Culture: NO GROWTH

## 2024-06-16 NOTE — Progress Notes (Deleted)
 PATIENT: April Sheppard DOB: 12-21-1945  REASON FOR VISIT: follow up HISTORY FROM: patient PRIMARY NEUROLOGIST: Dr. Rosemarie  No chief complaint on file.    HISTORY OF PRESENT ILLNESS: Today   April Sheppard is a 78 y.o. female who has been followed in this office for ***. Returns today for follow-up.   Imaging:   MRI BRAIN: IMPRESSION: 1. Extensive acute posterior circulation infarcts in Barrett setting of vertebral artery occlusion: right >left PICA territory involvement, right lateral medullary, and bifalamic involvement. 2. Cytotoxic edema without hemorrhagic transformation or mass effect. 3. Underlying chronic ischemic disease in [name] cerebellum, left MCA, and bilateral PCA territories.  CTA head/neck: IMPRESSION: 1. Left vertebral artery origin occlusion with nonopacification in [place] neck and intradurally. 2. Severe right V2 vertebral artery stenosis with occlusion of [place] right intradural vertebral artery. 3. Bilateral P2 PCA occlusion. 4. Small and severely stenotic basilar artery. 5. Severely stenotic vs occluded mid right M2 MCA with distal reconstitution. 6. Severe left and moderate right intracranial ICA stenosis. 7. Severe left M2 MCA stenosis. 8. Severe left A2 ACA stenosis. 9. Right upper lobe nodular opacities, most likely infectious/inflammatory.   FU: Labs: Carotid dopplers? ECHO: Discharge note Work? OSA?   HISTORY (copied from Hospital)April Sheppard is a 78 y.o. female with history of HTN, HLD, DM, CAD s/p CABG in 2022 admitted for nausea vomiting, headache, altered mental status, right facial droop, and found to be in DKA. No TNK given due to outside window.     Stroke:  bilateral cerebellar infarcts R>L, and bilateral thalamic infarct likely secondary to diffuse severe intracranial stenosis due to uncontrolled risk factors CT R>L cerebellar infarcts CTA head and neck left VA origin occlusion, right P2 severe stenosis, right V4 occlusion, bilateral P2 occlusion,  basilar artery severe stenosis, left more than right ICA siphon moderate to severe stenosis.  Severe left M2 and A2 stenosis. MRI  bilateral cerebellar infarcts R>L, and bilateral thalamic infarcts 2D Echo EF 60 to 65% LDL 189 HgbA1c 13.2 Lovenox for VTE prophylaxis No antithrombotic prior to admission, now on aspirin 81 mg daily and clopidogrel 75 mg daily for 3 months and then aspirin alone given large vessel disease. Ongoing aggressive stroke risk factor management Therapy recommendations:  CIR Disposition: Pending   Diabetes Found to be in DKA on presentation HgbA1c 13.2 goal < 7.0 Controlled Currently on lantus  CBG monitoring SSI DM education and close PCP follow up   Hypertension Unstable on [place] high end Gradually normalize BP in 2-3 days Long term BP goal normotensive   Hyperlipidemia Home meds:  none  LDL 189, goal < 70 Now on lipitor 80 Continue statin at discharge   PAD Faires doppler showed b/l arterial occlusion is noted in [place] distal femoral artery. Monophasic flow is noted in [place] popliteal, posterior tibial, peroneal, and anterior tibial  arteries.  Asymptomatic at this time On DAPT now   Other Stroke Risk Factors Advanced age Coronary artery disease s/p CABG 2022   Other Active Problems Leukocytosis WBC 12.4--16.4    REVIEW OF SYSTEMS: Out of a complete 14 system review of symptoms, [place] patient complains only of [place] following symptoms, and all other reviewed systems are negative.  ALLERGIES: Allergies  Allergen Reactions   Lisinopril Swelling and Rash    HOME MEDICATIONS: Outpatient Medications Prior to Visit  Medication Sig Dispense Refill   amLODipine  (NORVASC ) 10 MG tablet Take 1 tablet (10 mg total) by mouth daily. 30 tablet 0   aspirin EC  81 MG tablet Take 1 tablet (81 mg total) by mouth daily. Swallow whole.     atorvastatin (LIPITOR) 80 MG tablet Take 1 tablet (80 mg total) by mouth daily.     carvedilol  (COREG ) 12.5 MG tablet Take 0.5  tablets (6.25 mg total) by mouth 2 (two) times daily with a meal.     clopidogrel (PLAVIX) 75 MG tablet Take 1 tablet (75 mg total) by mouth daily.     feeding supplement (ENSURE PLUS HIGH PROTEIN) LIQD Take 237 mLs by mouth 2 (two) times daily between meals.     insulin aspart (NOVOLOG FLEXPEN) 100 UNIT/ML FlexPen 0-9 Units, Subcutaneous, 3 times daily with meals CBG < 70: Implement Hypoglycemia measures CBG 70 - 120: 0 units CBG 121 - 150: 1 unit CBG 151 - 200: 2 units CBG 201 - 250: 3 units CBG 251 - 300: 5 units CBG 301 - 350: 7 units CBG 351 - 400: 9 units CBG > 400: call MD     insulin glargine (LANTUS) 100 UNIT/ML injection Inject 0.05 mLs (5 Units total) into [name] skin daily.     levothyroxine (SYNTHROID) 100 MCG tablet Take 1 tablet (100 mcg total) by mouth daily at 6 (six) AM.     melatonin 10 MG TABS Take 10 mg by mouth at bedtime.     metFORMIN (GLUCOPHAGE) 1000 MG tablet Take 1,000 mg by mouth 2 (two) times daily with a meal.     QUEtiapine (SEROQUEL) 50 MG tablet Take 1 tablet (50 mg total) by mouth at bedtime.     tamsulosin (FLOMAX) 0.4 MG CAPS capsule Take 1 capsule (0.4 mg total) by mouth daily.     No facility-administered medications prior to visit.    PAST MEDICAL HISTORY: Past Medical History:  Diagnosis Date   Diabetes mellitus without complication (HCC)    Hypertension    Thyroid disease     PAST SURGICAL HISTORY: No past surgical history on file.  FAMILY HISTORY: No family history on file.  SOCIAL HISTORY: Social History   Socioeconomic History   Marital status: Widowed    Spouse name: Not on file   Number of children: Not on file   Years of education: Not on file   Highest education level: Not on file  Occupational History   Not on file  Tobacco Use   Smoking status: Never   Smokeless tobacco: Not on file  Substance and Sexual Activity   Alcohol use: No   Drug use: No   Sexual activity: Not on file  Other Topics Concern   Not on file  Social  History Narrative   Not on file   Social Drivers of Health   Financial Resource Strain: Not on file  Food Insecurity: No Food Insecurity (05/09/2024)   Hunger Vital Sign    Worried About Running Out of Food in [place] Last Year: Never true    Ran Out of Food in [place] Last Year: Never true  Transportation Needs: No Transportation Needs (05/09/2024)   PRAPARE - Administrator, Civil Service (Medical): No    Lack of Transportation (Non-Medical): No  Physical Activity: Not on file  Stress: Not on file  Social Connections: Patient Unable To Answer (05/10/2024)   Social Connection and Isolation Panel    Frequency of Communication with Friends and Family: Patient unable to answer    Frequency of Social Gatherings with Friends and Family: Patient unable to answer    Attends Religious Services: Patient unable to  answer    Active Member of Clubs or Organizations: Patient unable to answer    Attends Club or Organization Meetings: Patient unable to answer    Marital Status: Patient unable to answer  Intimate Partner Violence: Not At Risk (05/09/2024)   Humiliation, Afraid, Rape, and Kick questionnaire    Fear of Current or Ex-Partner: No    Emotionally Abused: No    Physically Abused: No    Sexually Abused: No      PHYSICAL EXAM  There were no vitals filed for this visit. There is no height or weight on file to calculate BMI.  Generalized: Well developed, in no acute distress   Neurological examination  Mentation: Alert oriented to time, place, history taking. Follows all commands speech and language fluent Cranial nerve II-XII: Pupils were equal round reactive to light. Extraocular movements were full, visual field were full on confrontational test. Facial sensation and strength were normal. Uvula tongue midline. Head turning and shoulder shrug  were normal and symmetric. Motor: [place] motor testing reveals 5 over 5 strength of all 4 extremities. Good symmetric motor tone is noted  throughout.  Sensory: Sensory testing is intact to soft touch on all 4 extremities. No evidence of extinction is noted.  Coordination: Cerebellar testing reveals good finger-nose-finger and heel-to-shin bilaterally.  Gait and station: Gait is normal. Tandem gait is normal. Romberg is negative. No drift is seen.  Reflexes: Deep tendon reflexes are symmetric and normal bilaterally.   DIAGNOSTIC DATA (LABS, IMAGING, TESTING) - I reviewed patient records, labs, notes, testing and imaging myself where available.  Lab Results  Component Value Date   WBC 12.0 (H) 05/11/2024   HGB 15.8 (H) 05/11/2024   HCT 44.1 05/11/2024   MCV 82.3 05/11/2024   PLT 250 05/11/2024      Component Value Date/Time   NA 134 (L) 05/13/2024 0244   K 4.0 05/13/2024 0244   CL 103 05/13/2024 0244   CO2 19 (L) 05/13/2024 0244   GLUCOSE 83 05/13/2024 0244   BUN 15 05/13/2024 0244   CREATININE 0.55 05/13/2024 0244   CALCIUM 8.7 (L) 05/13/2024 0244   PROT 5.9 (L) 05/10/2024 0835   ALBUMIN 3.1 (L) 05/10/2024 0835   AST 27 05/10/2024 0835   ALT 16 05/10/2024 0835   ALKPHOS 165 (H) 05/10/2024 0835   BILITOT 1.5 (H) 05/10/2024 0835   GFRNONAA >60 05/13/2024 0244   GFRAA >90 05/17/2011 1228   Lab Results  Component Value Date   CHOL 266 (H) 05/10/2024   HDL 61 05/10/2024   LDLCALC 189 (H) 05/10/2024   TRIG 78 05/10/2024   CHOLHDL 4.4 05/10/2024   Lab Results  Component Value Date   HGBA1C 13.2 (H) 05/08/2024   No results found for: VITAMINB12 Lab Results  Component Value Date   TSH 8.070 (H) 05/09/2024      ASSESSMENT AND PLAN 78 y.o. year old female  has a past medical history of Diabetes mellitus without complication (HCC), Hypertension, and Thyroid disease. here with ***     Continue {anticoagulants:31417}  and ***  for secondary stroke prevention.   Discussed secondary stroke prevention measures and importance of close PCP follow up for aggressive stroke risk factor management. I have gone  over [place] pathophysiology of stroke, warning signs and symptoms, risk factors and their management in some detail with instructions to go to [place] closest emergency room for symptoms of concern. HTN: BP goal <130/90.  Stable on *** per PCP HLD: LDL goal <70. Recent LDL  189.  DMII: A1c goal<7.0. Recent A1c 13.2.  Encouraged patient to monitor diet and encouraged exercise FU with our office ***  No orders of Jerrilyn defined types were placed in this encounter.  No orders of [place] defined types were placed in this encounter.     Duwaine Russell, MSN, NP-C 06/16/2024, 3:53 PM [facility] [address], [address] [place], [place] [address] [phone]

## 2024-06-17 ENCOUNTER — Encounter: Payer: Self-pay | Admitting: Adult Health

## 2024-06-17 ENCOUNTER — Inpatient Hospital Stay: Admitting: Adult Health
